# Patient Record
Sex: Male | Born: 1960 | Race: White | Hispanic: No | Marital: Married | Smoking: Current some day smoker
Health system: Southern US, Community
[De-identification: ages and names within clinical notes are randomized; demographics above are authoritative.]

## PROBLEM LIST (undated history)

## (undated) DIAGNOSIS — R51 Headache: Secondary | ICD-10-CM

## (undated) DIAGNOSIS — R519 Headache, unspecified: Secondary | ICD-10-CM

## (undated) DIAGNOSIS — S0291XA Unspecified fracture of skull, initial encounter for closed fracture: Secondary | ICD-10-CM

## (undated) DIAGNOSIS — M1990 Unspecified osteoarthritis, unspecified site: Secondary | ICD-10-CM

## (undated) HISTORY — PX: TONSILLECTOMY: SUR1361

---

## 1997-11-23 ENCOUNTER — Emergency Department (HOSPITAL_COMMUNITY): Admission: EM | Admit: 1997-11-23 | Discharge: 1997-11-23 | Payer: Self-pay | Admitting: Emergency Medicine

## 1997-11-23 ENCOUNTER — Encounter: Payer: Self-pay | Admitting: Emergency Medicine

## 2001-03-19 ENCOUNTER — Emergency Department (HOSPITAL_COMMUNITY): Admission: EM | Admit: 2001-03-19 | Discharge: 2001-03-19 | Payer: Self-pay | Admitting: Emergency Medicine

## 2001-03-19 ENCOUNTER — Encounter: Payer: Self-pay | Admitting: Emergency Medicine

## 2001-03-27 ENCOUNTER — Encounter: Payer: Self-pay | Admitting: Cardiovascular Disease

## 2001-03-27 ENCOUNTER — Ambulatory Visit (HOSPITAL_COMMUNITY): Admission: RE | Admit: 2001-03-27 | Discharge: 2001-03-27 | Payer: Self-pay | Admitting: Cardiovascular Disease

## 2003-09-02 ENCOUNTER — Emergency Department (HOSPITAL_COMMUNITY): Admission: EM | Admit: 2003-09-02 | Discharge: 2003-09-02 | Payer: Self-pay | Admitting: Emergency Medicine

## 2014-06-13 ENCOUNTER — Emergency Department (INDEPENDENT_AMBULATORY_CARE_PROVIDER_SITE_OTHER)
Admission: EM | Admit: 2014-06-13 | Discharge: 2014-06-13 | Disposition: A | Discharge status: Home / Self Care | Payer: BLUE CROSS/BLUE SHIELD | Source: Home / Self Care | Attending: Family Medicine | Admitting: Family Medicine

## 2014-06-13 ENCOUNTER — Encounter (HOSPITAL_COMMUNITY): Payer: Self-pay | Admitting: Emergency Medicine

## 2014-06-13 DIAGNOSIS — K088 Other specified disorders of teeth and supporting structures: Secondary | ICD-10-CM | POA: Diagnosis not present

## 2014-06-13 DIAGNOSIS — H6122 Impacted cerumen, left ear: Secondary | ICD-10-CM

## 2014-06-13 DIAGNOSIS — K0889 Other specified disorders of teeth and supporting structures: Secondary | ICD-10-CM

## 2014-06-13 MED ORDER — TRAMADOL HCL 50 MG PO TABS: 50.0000 mg | ORAL_TABLET | Freq: Four times a day (QID) | ORAL | Status: DC | PRN

## 2014-06-13 MED ORDER — CLINDAMYCIN HCL 300 MG PO CAPS: 300.0000 mg | ORAL_CAPSULE | Freq: Three times a day (TID) | ORAL | Status: DC

## 2014-06-13 MED ORDER — DOCUSATE SODIUM 50 MG/5ML PO LIQD
ORAL | Status: AC
  Filled 2014-06-13: qty 10

## 2014-06-13 NOTE — ED Notes (Signed)
C/o dental pain and cerumen impaction States he has right side ear pain due to cerumen impaction States he has throbbing pain States right side of mouth hurts States he has a Education officer, communitydentist appt on Thursday

## 2014-06-13 NOTE — Discharge Instructions (Signed)
Thank you for coming in today. Follow up with dentist.    Dental Pain A tooth ache may be caused by cavities (tooth decay). Cavities expose the nerve of the tooth to air and hot or cold temperatures. It may come from an infection or abscess (also called a boil or furuncle) around your tooth. It is also often caused by dental caries (tooth decay). This causes the pain you are having. DIAGNOSIS  Your caregiver can diagnose this problem by exam. TREATMENT   If caused by an infection, it may be treated with medications which kill germs (antibiotics) and pain medications as prescribed by your caregiver. Take medications as directed.  Only take over-the-counter or prescription medicines for pain, discomfort, or fever as directed by your caregiver.  Whether the tooth ache today is caused by infection or dental disease, you should see your dentist as soon as possible for further care. SEEK MEDICAL CARE IF: The exam and treatment you received today has been provided on an emergency basis only. This is not a substitute for complete medical or dental care. If your problem worsens or new problems (symptoms) appear, and you are unable to meet with your dentist, call or return to this location. SEEK IMMEDIATE MEDICAL CARE IF:   You have a fever.  You develop redness and swelling of your face, jaw, or neck.  You are unable to open your mouth.  You have severe pain uncontrolled by pain medicine. MAKE SURE YOU:   Understand these instructions.  Will watch your condition.  Will get help right away if you are not doing well or get worse. Document Released: 01/21/2005 Document Revised: 04/15/2011 Document Reviewed: 09/09/2007 [facility] Patient Information 2015 [place]Care, [place]. This information is not intended to replace advice given to you by your health care provider. Make sure you discuss any questions you have with your health care provider.   Cerumen Impaction A cerumen impaction is when the wax  in your ear forms a plug. This plug usually causes reduced hearing. Sometimes it also causes an earache or dizziness. Removing a cerumen impaction can be difficult and painful. The wax sticks to the ear canal. The canal is sensitive and bleeds easily. If you try to remove a heavy wax buildup with a cotton tipped swab, you may push it in further. Irrigation with water, suction, and small ear curettes may be used to clear out the wax. If the impaction is fixed to the skin in the ear canal, ear drops may be needed for a few days to loosen the wax. People who build up a lot of wax frequently can use ear wax removal products available in your local drugstore. SEEK MEDICAL CARE IF:  You develop an earache, increased hearing loss, or marked dizziness. Document Released: 02/29/2004 Document Revised: 04/15/2011 Document Reviewed: 04/20/2009 [facility] Patient Information 2015 [place]ExitCare, [place]LLC. This information is not intended to replace advice given to you by your health care provider. Make sure you discuss any questions you have with your health care provider.

## 2014-06-13 NOTE — ED Provider Notes (Signed)
Si RaiderChristopher A Mueller is a 54 y.o. male who presents to Urgent Care today for right-sided dental pain. Patient has right mandibular dental pain starting 3 days ago. The pain has become severe and radiates to his ear and his angle of the jaw. Pain is throbbing. He has made an appointment with his dentist for Thursday. He's tried lots of ibuprofen which helps only a little. He denies any fevers or chills nausea vomiting or diarrhea. Additionally he suspects that he has left-sided cerumen impaction which is a frequent occurrence for him.   History reviewed. No pertinent past medical history. No past surgical history on file. History  Substance Use Topics  . Smoking status: Not on file  . Smokeless tobacco: Not on file  . Alcohol Use: Not on file   ROS as above Medications: No current facility-administered medications for this encounter.   Current Outpatient Prescriptions  Medication Sig Dispense Refill  . clindamycin (CLEOCIN) 300 MG capsule Take 1 capsule (300 mg total) by mouth 3 (three) times daily. 30 capsule 0  . traMADol (ULTRAM) 50 MG tablet Take 1 tablet (50 mg total) by mouth every 6 (six) hours as needed. 10 tablet 0   No Known Allergies   Exam:  BP 115/75 mmHg  Pulse 77  Temp(Src) 99.4 F (37.4 C) (Oral)  Resp 14  SpO2 97% Gen: Well NAD HEENT: EOMI,  MMM cerumen impaction right is normal. Right lower molar with cavity tender to touch with gumline erythema without abscess. Tender cervical right lymphadenopathy present  Lungs: Normal work of breathing. CTABL Heart: RRR no MRG Abd: NABS, Soft. Nondistended, Nontender Exts: Brisk capillary refill, warm and well perfused.   Patient had cerumen irrigation of the left ear.  No results found for this or any previous visit (from the past 24 hour(s)). No results found.  Assessment and Plan: 54 y.o. male with  1) dental pain: Treat with clindamycin. I suspect patient is developing a dental infection and may have  lymphadenitis. Follow-up with Maurine Ministerennis. Treat pain with tramadol. 2) cerumen impaction irrigated. Return as needed.  Discussed warning signs or symptoms. Please see discharge instructions. Patient expresses understanding.     Rodolph BongEvan S Corey, MD 06/13/14 [phone]

## 2014-06-14 MED ORDER — DICLOFENAC POTASSIUM 50 MG PO TABS: 50.0000 mg | ORAL_TABLET | Freq: Three times a day (TID) | ORAL | Status: DC

## 2014-06-14 NOTE — ED Notes (Signed)
Pt  states still  Having  Pain   Dr  Artis Flockkindl  Notified   He    E  Rx    [facility]ic[facility]  [address]     Pt  Advised  To  followup  With  A  Dentist        As  Scheduled

## 2015-02-21 ENCOUNTER — Emergency Department (HOSPITAL_COMMUNITY): Payer: BLUE CROSS/BLUE SHIELD

## 2015-02-21 ENCOUNTER — Emergency Department (HOSPITAL_COMMUNITY)
Admission: EM | Admit: 2015-02-21 | Discharge: 2015-02-21 | Disposition: A | Discharge status: Home / Self Care | Payer: BLUE CROSS/BLUE SHIELD | Attending: Physician Assistant | Admitting: Physician Assistant

## 2015-02-21 ENCOUNTER — Encounter (HOSPITAL_COMMUNITY): Payer: Self-pay | Admitting: Emergency Medicine

## 2015-02-21 DIAGNOSIS — Y998 Other external cause status: Secondary | ICD-10-CM | POA: Diagnosis not present

## 2015-02-21 DIAGNOSIS — S4991XA Unspecified injury of right shoulder and upper arm, initial encounter: Secondary | ICD-10-CM | POA: Diagnosis not present

## 2015-02-21 DIAGNOSIS — S299XXA Unspecified injury of thorax, initial encounter: Secondary | ICD-10-CM | POA: Insufficient documentation

## 2015-02-21 DIAGNOSIS — Y92410 Unspecified street and highway as the place of occurrence of the external cause: Secondary | ICD-10-CM | POA: Insufficient documentation

## 2015-02-21 DIAGNOSIS — Y9389 Activity, other specified: Secondary | ICD-10-CM | POA: Diagnosis not present

## 2015-02-21 DIAGNOSIS — Z041 Encounter for examination and observation following transport accident: Secondary | ICD-10-CM

## 2015-02-21 DIAGNOSIS — S0993XA Unspecified injury of face, initial encounter: Secondary | ICD-10-CM | POA: Diagnosis not present

## 2015-02-21 DIAGNOSIS — R0781 Pleurodynia: Secondary | ICD-10-CM

## 2015-02-21 DIAGNOSIS — Z043 Encounter for examination and observation following other accident: Secondary | ICD-10-CM

## 2015-02-21 MED ORDER — OXYCODONE-ACETAMINOPHEN 5-325 MG PO TABS: 2.0000 | ORAL_TABLET | ORAL | Status: DC | PRN

## 2015-02-21 MED ORDER — IBUPROFEN 800 MG PO TABS: 800.0000 mg | ORAL_TABLET | Freq: Three times a day (TID) | ORAL | Status: DC
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
## 2015-02-21 NOTE — ED Notes (Signed)
ED PA at bedside
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
## 2015-02-27 ENCOUNTER — Emergency Department (HOSPITAL_COMMUNITY)
Admission: EM | Admit: 2015-02-27 | Discharge: 2015-02-27 | Disposition: A | Discharge status: Home / Self Care | Payer: BLUE CROSS/BLUE SHIELD | Attending: Emergency Medicine | Admitting: Emergency Medicine

## 2015-02-27 ENCOUNTER — Emergency Department (HOSPITAL_COMMUNITY): Payer: BLUE CROSS/BLUE SHIELD

## 2015-02-27 ENCOUNTER — Encounter (HOSPITAL_COMMUNITY): Payer: Self-pay | Admitting: Emergency Medicine

## 2015-02-27 DIAGNOSIS — M546 Pain in thoracic spine: Secondary | ICD-10-CM | POA: Diagnosis present

## 2015-02-27 DIAGNOSIS — S29002D Unspecified injury of muscle and tendon of back wall of thorax, subsequent encounter: Secondary | ICD-10-CM | POA: Diagnosis not present

## 2015-02-27 DIAGNOSIS — M542 Cervicalgia: Secondary | ICD-10-CM | POA: Insufficient documentation

## 2015-02-27 DIAGNOSIS — Z791 Long term (current) use of non-steroidal anti-inflammatories (NSAID): Secondary | ICD-10-CM | POA: Diagnosis not present

## 2015-02-27 DIAGNOSIS — R0789 Other chest pain: Secondary | ICD-10-CM | POA: Diagnosis not present

## 2015-02-27 LAB — COMPREHENSIVE METABOLIC PANEL
ALT: 13 U/L — AB (ref 17–63)
AST: 21 U/L (ref 15–41)
Albumin: 3.8 g/dL (ref 3.5–5.0)
Alkaline Phosphatase: 46 U/L (ref 38–126)
Anion gap: 9 (ref 5–15)
BUN: 15 mg/dL (ref 6–20)
CALCIUM: 8.7 mg/dL — AB (ref 8.9–10.3)
CO2: 27 mmol/L (ref 22–32)
Chloride: 104 mmol/L (ref 101–111)
Creatinine, Ser: 0.69 mg/dL (ref 0.61–1.24)
GFR calc Af Amer: >60 mL/min (ref >60)
GFR calc non Af Amer: >60 mL/min (ref >60)
Glucose, Bld: 122 mg/dL — ABNORMAL HIGH (ref 65–99)
Potassium: 4.4 mmol/L (ref 3.5–5.1)
Sodium: 140 mmol/L (ref 135–145)
Total Bilirubin: 0.3 mg/dL (ref 0.3–1.2)
Total Protein: 6.2 g/dL — ABNORMAL LOW (ref 6.5–8.1)

## 2015-02-27 LAB — CBC WITH DIFFERENTIAL/PLATELET
BASOS PCT: 0 %
Basophils Absolute: 0 10*3/uL (ref 0.0–0.1)
Eosinophils Absolute: 0.3 10*3/uL (ref 0.0–0.7)
Eosinophils Relative: 3 %
HCT: 40.9 % (ref 39.0–52.0)
HEMOGLOBIN: 13.4 g/dL (ref 13.0–17.0)
LYMPHS PCT: 13 %
Lymphs Abs: 1.2 10*3/uL (ref 0.7–4.0)
MCH: 30.6 pg (ref 26.0–34.0)
MCHC: 32.8 g/dL (ref 30.0–36.0)
MCV: 93.4 fL (ref 78.0–100.0)
MONOS PCT: 8 %
Monocytes Absolute: 0.8 10*3/uL (ref 0.1–1.0)
NEUTROS ABS: 7.2 10*3/uL (ref 1.7–7.7)
Neutrophils Relative %: 76 %
Platelets: 225 10*3/uL (ref 150–400)
RBC: 4.38 MIL/uL (ref 4.22–5.81)
RDW: 12.9 % (ref 11.5–15.5)
WBC: 9.5 10*3/uL (ref 4.0–10.5)

## 2015-02-27 MED ORDER — IOHEXOL 350 MG/ML SOLN
100.0000 mL | Freq: Once | INTRAVENOUS | Status: AC | PRN
  Administered 2015-02-27: 100 mL via INTRAVENOUS

## 2015-02-27 MED ORDER — NAPROXEN 500 MG PO TABS: 500.0000 mg | ORAL_TABLET | Freq: Two times a day (BID) | ORAL | Status: DC

## 2015-02-27 MED ORDER — ORPHENADRINE CITRATE ER 100 MG PO TB12: 100.0000 mg | ORAL_TABLET | Freq: Two times a day (BID) | ORAL | Status: DC

## 2015-02-27 NOTE — ED Notes (Signed)
MD at bedside. EDP PRESENT 
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
## 2015-02-27 NOTE — ED Notes (Signed)
Pt taking on the telephone. Pt at present appears in NAD

## 2015-02-27 NOTE — ED Notes (Signed)
Pt reports pain  rx given at last visit has been completed and gave good relief.

## 2015-02-27 NOTE — ED Notes (Signed)
Patient transported to CT 

## 2015-02-27 NOTE — ED Notes (Signed)
Pt talking on phone inquiring about rental car. Informed would return after his call. Gave pt call bell

## 2015-02-27 NOTE — Discharge Instructions (Signed)
Back Pain, Adult At this time, there is no new fracture identified. You had a small abnormality of one of the vertebral bodies at T8 that was pre-existing. However he must follow-up with your physician because a small lung nodule that will require ongoing surveillance has been identified. You may need repeat CT scans at 6 or 12 months. Do not fail to schedule a follow-up appointment. Back pain is very common in adults.The cause of back pain is rarely dangerous and the pain often gets better over time.The cause of your back pain may not be known. Some common causes of back pain include:  Strain of the muscles or ligaments supporting the spine.  Wear and tear (degeneration) of the spinal disks.  Arthritis.  Direct injury to the back. For many people, back pain may return. Since back pain is rarely dangerous, most people can learn to manage this condition on their own. HOME CARE INSTRUCTIONS Watch your back pain for any changes. The following actions may help to lessen any discomfort you are feeling:  Remain active. It is stressful on your back to sit or stand in one place for long periods of time. Do not sit, drive, or stand in one place for more than 30 minutes at a time. Take short walks on even surfaces as soon as you are able.Try to increase the length of time you walk each day.  Exercise regularly as directed by your health care provider. Exercise helps your back heal faster. It also helps avoid future injury by keeping your muscles strong and flexible.  Do not stay in bed.Resting more than 1-2 days can delay your recovery.  Pay attention to your body when you bend and lift. The most comfortable positions are those that put less stress on your recovering back. Always use proper lifting techniques, including:  Bending your knees.  Keeping the load close to your body.  Avoiding twisting.  Find a comfortable position to sleep. Use a firm mattress and lie on your side with your knees  slightly bent. If you lie on your back, put a pillow under your knees.  Avoid feeling anxious or stressed.Stress increases muscle tension and can worsen back pain.It is important to recognize when you are anxious or stressed and learn ways to manage it, such as with exercise.  Take medicines only as directed by your health care provider. Over-the-counter medicines to reduce pain and inflammation are often the most helpful.Your health care provider may prescribe muscle relaxant drugs.These medicines help dull your pain so you can more quickly return to your normal activities and healthy exercise.  Apply ice to the injured area:  Put ice in a plastic bag.  Place a towel between your skin and the bag.  Leave the ice on for 20 minutes, 2-3 times a day for the first 2-3 days. After that, ice and heat may be alternated to reduce pain and spasms.  Maintain a healthy weight. Excess weight puts extra stress on your back and makes it difficult to maintain good posture. SEEK MEDICAL CARE IF:  You have pain that is not relieved with rest or medicine.  You have increasing pain going down into the legs or buttocks.  You have pain that does not improve in one week.  You have night pain.  You lose weight.  You have a fever or chills. SEEK IMMEDIATE MEDICAL CARE IF:   You develop new bowel or bladder control problems.  You have unusual weakness or numbness in your arms or  legs.  You develop nausea or vomiting.  You develop abdominal pain.  You feel faint.   This information is not intended to replace advice given to you by your health care provider. Make sure you discuss any questions you have with your health care provider.   Document Released: 01/21/2005 Document Revised: 02/11/2014 Document Reviewed: 05/25/2013 Elsevier Interactive Patient Education [employer].

## 2016-01-06 ENCOUNTER — Encounter (HOSPITAL_COMMUNITY): Payer: Self-pay | Admitting: *Deleted

## 2016-01-06 ENCOUNTER — Ambulatory Visit (HOSPITAL_COMMUNITY)
Admission: EM | Admit: 2016-01-06 | Discharge: 2016-01-06 | Disposition: A | Discharge status: Home / Self Care | Payer: BLUE CROSS/BLUE SHIELD | Attending: Emergency Medicine | Admitting: Emergency Medicine

## 2016-01-06 DIAGNOSIS — F17290 Nicotine dependence, other tobacco product, uncomplicated: Secondary | ICD-10-CM | POA: Insufficient documentation

## 2016-01-06 DIAGNOSIS — H1031 Unspecified acute conjunctivitis, right eye: Secondary | ICD-10-CM | POA: Diagnosis not present

## 2016-01-06 DIAGNOSIS — J0100 Acute maxillary sinusitis, unspecified: Secondary | ICD-10-CM | POA: Diagnosis not present

## 2016-01-06 DIAGNOSIS — H1032 Unspecified acute conjunctivitis, left eye: Secondary | ICD-10-CM | POA: Insufficient documentation

## 2016-01-06 DIAGNOSIS — Z79899 Other long term (current) drug therapy: Secondary | ICD-10-CM | POA: Diagnosis not present

## 2016-01-06 HISTORY — DX: Unspecified fracture of skull, initial encounter for closed fracture: S02.91XA

## 2016-01-06 LAB — POCT RAPID STREP A: Streptococcus, Group A Screen (Direct): NEGATIVE

## 2016-01-06 MED ORDER — CIPROFLOXACIN HCL 0.3 % OP SOLN: 1.0000 [drp] | OPHTHALMIC | 0 refills | Status: DC

## 2016-01-06 MED ORDER — FLUORESCEIN SODIUM 1 MG OP STRP
ORAL_STRIP | OPHTHALMIC | Status: AC
  Filled 2016-01-06: qty 1

## 2016-01-06 MED ORDER — GUAIFENESIN ER 600 MG PO TB12: 1200.0000 mg | ORAL_TABLET | Freq: Two times a day (BID) | ORAL | 0 refills | Status: DC

## 2016-01-06 MED ORDER — FEXOFENADINE-PSEUDOEPHED ER 60-120 MG PO TB12: 1.0000 | ORAL_TABLET | Freq: Two times a day (BID) | ORAL | 0 refills | Status: DC

## 2016-01-06 MED ORDER — AMOXICILLIN-POT CLAVULANATE 875-125 MG PO TABS: 1.0000 | ORAL_TABLET | Freq: Two times a day (BID) | ORAL | 0 refills | Status: DC

## 2016-01-06 MED ORDER — TETRACAINE HCL 0.5 % OP SOLN
OPHTHALMIC | Status: AC
  Filled 2016-01-06: qty 2

## 2016-01-06 MED ORDER — FLUTICASONE PROPIONATE 50 MCG/ACT NA SUSP: 1.0000 | Freq: Every day | NASAL | 2 refills | Status: DC

## 2016-01-06 NOTE — ED Provider Notes (Signed)
[facility]    CSN: [id number] Arrival date & time: 01/06/16  1203     History   Chief Complaint Chief Complaint  Patient presents with  . Eye Problem    HPI Bob Mueller is a 55 y.o. male.   HPI Patient presents to the clinic today with complaints of sinus congestion/pain, right greater than left eye pain and redness. States that over a week ago he was having "flulike" symptoms with cough, sinus congestion, body aches, chills and sore throat.  He did not go see a physician for this. Last Tuesday he began having increased right eye redness and slight purulent discharge. Itching in and around his eye. Starting having the same symptoms with the left eye yesterday.  No complaints of photophobia, blurred vision, headache, dizziness. He has been taking over-the-counter medication without improvement.  Continues to have postnasal drainage with nonproductive cough. No chest pain or shortness of breath.   Past Medical History:  Diagnosis Date  . CAD (coronary artery disease)   . Skull fracture (HCC)     There are no active problems to display for this patient.   Past Surgical History:  Procedure Laterality Date  . TONSILLECTOMY         Home Medications    Prior to Admission medications   Medication Sig Start Date End Date Taking? Authorizing Provider  ibuprofen (ADVIL,MOTRIN) 200 MG tablet Take 600-800 mg by mouth every 6 (six) hours as needed for moderate pain.    Yes Historical Provider, MD  orphenadrine (NORFLEX) 100 MG tablet Take 1 tablet (100 mg total) by mouth 2 (two) times daily. 02/27/15  Yes Arby BarretteMarcy Pfeiffer, MD  amoxicillin-clavulanate (AUGMENTIN) 875-125 MG tablet Take 1 tablet by mouth every 12 (twelve) hours. 01/06/16   Naida SleightJames M Kiaya Haliburton, PA-C  ciprofloxacin (CILOXAN) 0.3 % ophthalmic solution Place 1 drop into both eyes every 2 (two) hours. Administer 1 drop, every 2 hours, while awake, for 2 days. Then 1 drop, every 4 hours, while awake, for the  next 5 days. 01/06/16   Naida SleightJames M Aalani Aikens, PA-C  fexofenadine-pseudoephedrine (ALLEGRA-D) 60-120 MG 12 hr tablet Take 1 tablet by mouth every 12 (twelve) hours. 01/06/16   Naida SleightJames M Chaela Branscum, PA-C  fluticasone (FLONASE) 50 MCG/ACT nasal spray Place 1 spray into both nostrils daily. 01/06/16   Naida SleightJames M Squire Withey, PA-C  guaiFENesin (MUCINEX) 600 MG 12 hr tablet Take 2 tablets (1,200 mg total) by mouth 2 (two) times daily. 01/06/16   Naida SleightJames M Aydenn Gervin, PA-C  ibuprofen (ADVIL,MOTRIN) 800 MG tablet Take 1 tablet (800 mg total) by mouth 3 (three) times daily. 02/21/15   Shawn C Joy, PA-C  naproxen (NAPROSYN) 500 MG tablet Take 1 tablet (500 mg total) by mouth 2 (two) times daily. 02/27/15   Arby BarretteMarcy Pfeiffer, MD  oxyCODONE-acetaminophen (PERCOCET/ROXICET) 5-325 MG tablet Take 2 tablets by mouth every 4 (four) hours as needed for severe pain. 02/21/15   Shawn C Joy, PA-C  pseudoephedrine-acetaminophen (TYLENOL SINUS) 30-500 MG TABS tablet Take 2 tablets by mouth every 4 (four) hours as needed (cold symptoms).    Historical Provider, MD    Family History No family history on file.  Social History Social History  Substance Use Topics  . Smoking status: Current Some Day Smoker    Types: Cigars  . Smokeless tobacco: Never Used     Comment: smokes cigar "very rarely"  . Alcohol use Yes     Comment: rarely     Allergies   Patient has no  known allergies.   Review of Systems Review of Systems  Constitutional: Positive for fatigue. Negative for chills and fever.  HENT: Positive for congestion and facial swelling. Negative for ear pain and hearing loss.   Eyes: Positive for pain, discharge, redness and itching. Negative for photophobia and visual disturbance.  Respiratory: Positive for cough. Negative for apnea, chest tightness, shortness of breath and wheezing.   Cardiovascular: Negative for chest pain.  Gastrointestinal: Negative.   Genitourinary: Negative.   Musculoskeletal: Positive for myalgias.  Skin: Negative.     Neurological: Negative for light-headedness, numbness and headaches.  Psychiatric/Behavioral: Negative.      Physical Exam Triage Vital Signs ED Triage Vitals  Enc Vitals Group     BP 01/06/16 1234 122/77     Pulse Rate 01/06/16 1234 74     Resp 01/06/16 1234 16     Temp 01/06/16 1234 99.5 F (37.5 C)     Temp Source 01/06/16 1234 Oral     SpO2 01/06/16 1234 97 %     Weight --      Height --      Head Circumference --      Peak Flow --      Pain Score 01/06/16 1244 4     Pain Loc --      Pain Edu? --      Excl. in GC? --    No data found.   Updated Vital Signs BP 122/77   Pulse 74   Temp 99.5 F (37.5 C) (Oral)   Resp 16   SpO2 97%   Visual Acuity Right Eye Distance: 20/70 Left Eye Distance: 20/70 Bilateral Distance: 20/50  Right Eye Near:   Left Eye Near:    Bilateral Near:     Physical Exam  Constitutional: He is oriented to person, place, and time. He appears well-developed. No distress.  HENT:  Head: Normocephalic and atraumatic.  Does have some throat redness. No exudates. Moderate to marked bilateral frontal maxillary sinus tenderness. He does have slight right facial swelling.  Eyes: EOM are normal. Right eye exhibits discharge. Left eye exhibits discharge. Scleral icterus is present.  With extraocular movements patient does have bilateral frontal sinus soreness. No true eye pain.  Neck: Normal range of motion.  Cardiovascular: Normal rate.   Pulmonary/Chest: Effort normal. No respiratory distress.  Abdominal: He exhibits no distension.  Musculoskeletal: Normal range of motion.  Lymphadenopathy:    He has cervical adenopathy (Right side.).  Neurological: He is alert and oriented to person, place, and time.  Skin: Skin is warm and dry.  Psychiatric: He has a normal mood and affect.     UC Treatments / Results  Labs (all labs ordered are listed, but only abnormal results are displayed) Labs Reviewed  POCT RAPID STREP A    EKG  EKG  Interpretation None       Radiology No results found.  Procedures Procedures (including critical care time)  Medications Ordered in UC Medications - No data to display   Initial Impression / Assessment and Plan / UC Course  I have reviewed the triage vital signs and the nursing notes.  Pertinent labs & imaging results that were available during my care of the patient were reviewed by me and considered in my medical decision making (see chart for details).  Clinical Course      Final Clinical Impressions(s) / UC Diagnoses   Final diagnoses:  Acute non-recurrent maxillary sinusitis  Acute bacterial conjunctivitis of right eye  Acute bacterial conjunctivitis of left eye    New Prescriptions New Prescriptions   AMOXICILLIN-CLAVULANATE (AUGMENTIN) 875-125 MG TABLET    Take 1 tablet by mouth every 12 (twelve) hours.   CIPROFLOXACIN (CILOXAN) 0.3 % OPHTHALMIC SOLUTION    Place 1 drop into both eyes every 2 (two) hours. Administer 1 drop, every 2 hours, while awake, for 2 days. Then 1 drop, every 4 hours, while awake, for the next 5 days.   FEXOFENADINE-PSEUDOEPHEDRINE (ALLEGRA-D) 60-120 MG 12 HR TABLET    Take 1 tablet by mouth every 12 (twelve) hours.   FLUTICASONE (FLONASE) 50 MCG/ACT NASAL SPRAY    Place 1 spray into both nostrils daily.   GUAIFENESIN (MUCINEX) 600 MG 12 HR TABLET    Take 2 tablets (1,200 mg total) by mouth 2 (two) times daily.  Patient seen with my attending Dr. Piedad Climeshonig  do not think patient has any signs of orbital cellulitis. Patient will take all medications as prescribed. If his symptoms worsen or are not improved over the next few days he can return to our clinic to be seen. Also recommend that he be evaluated by his primary care physician. All questions answered.   Naida SleightJames M Monroe Qin, PA-C 01/06/16 1409

## 2016-01-06 NOTE — ED Notes (Signed)
Pt does not wear any corrective lenses.

## 2016-01-06 NOTE — ED Triage Notes (Signed)
Pt states recently recovering from self-described flu symptoms.  Started with right eye sxs 4 days ago; started with left eye sxs today.

## 2016-01-09 LAB — CULTURE, GROUP A STREP (THRC)

## 2016-05-27 ENCOUNTER — Encounter (INDEPENDENT_AMBULATORY_CARE_PROVIDER_SITE_OTHER): Payer: Self-pay

## 2016-05-27 ENCOUNTER — Ambulatory Visit (INDEPENDENT_AMBULATORY_CARE_PROVIDER_SITE_OTHER): Payer: BLUE CROSS/BLUE SHIELD | Admitting: Orthopaedic Surgery

## 2016-05-27 ENCOUNTER — Ambulatory Visit (INDEPENDENT_AMBULATORY_CARE_PROVIDER_SITE_OTHER): Payer: Self-pay

## 2016-05-27 ENCOUNTER — Encounter (INDEPENDENT_AMBULATORY_CARE_PROVIDER_SITE_OTHER): Payer: Self-pay | Admitting: Orthopaedic Surgery

## 2016-05-27 VITALS — BP 122/81 | HR 84 | Ht 71.0 in | Wt 160.0 lb

## 2016-05-27 DIAGNOSIS — M542 Cervicalgia: Secondary | ICD-10-CM

## 2016-05-27 DIAGNOSIS — M4722 Other spondylosis with radiculopathy, cervical region: Secondary | ICD-10-CM | POA: Diagnosis not present
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
## 2016-05-27 NOTE — Addendum Note (Signed)
Addended by: Rogers Seeds on: 05/27/2016 05:22 PM   Modules accepted: Orders

## 2016-06-06 ENCOUNTER — Ambulatory Visit
Admission: RE | Admit: 2016-06-06 | Discharge: 2016-06-06 | Disposition: A | Discharge status: Home / Self Care | Payer: BLUE CROSS/BLUE SHIELD | Source: Ambulatory Visit | Attending: Orthopaedic Surgery | Admitting: Orthopaedic Surgery

## 2016-06-06 DIAGNOSIS — M4722 Other spondylosis with radiculopathy, cervical region: Secondary | ICD-10-CM

## 2016-06-26 ENCOUNTER — Ambulatory Visit (INDEPENDENT_AMBULATORY_CARE_PROVIDER_SITE_OTHER): Payer: BLUE CROSS/BLUE SHIELD | Admitting: Orthopaedic Surgery

## 2016-06-26 ENCOUNTER — Encounter (INDEPENDENT_AMBULATORY_CARE_PROVIDER_SITE_OTHER): Payer: Self-pay | Admitting: Orthopaedic Surgery

## 2016-06-26 ENCOUNTER — Encounter (INDEPENDENT_AMBULATORY_CARE_PROVIDER_SITE_OTHER): Payer: Self-pay

## 2016-06-26 VITALS — BP 107/70 | HR 60 | Ht 71.0 in | Wt 160.0 lb

## 2016-06-26 DIAGNOSIS — M4722 Other spondylosis with radiculopathy, cervical region: Secondary | ICD-10-CM | POA: Diagnosis not present
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
## 2016-07-16 ENCOUNTER — Encounter (INDEPENDENT_AMBULATORY_CARE_PROVIDER_SITE_OTHER): Payer: Self-pay | Admitting: Orthopedic Surgery

## 2016-07-21 ENCOUNTER — Encounter (HOSPITAL_COMMUNITY): Payer: Self-pay | Admitting: Emergency Medicine

## 2016-07-21 ENCOUNTER — Emergency Department (HOSPITAL_COMMUNITY)
Admission: EM | Admit: 2016-07-21 | Discharge: 2016-07-21 | Disposition: A | Discharge status: Home / Self Care | Payer: BLUE CROSS/BLUE SHIELD | Attending: Emergency Medicine | Admitting: Emergency Medicine

## 2016-07-21 ENCOUNTER — Emergency Department (HOSPITAL_COMMUNITY): Payer: BLUE CROSS/BLUE SHIELD

## 2016-07-21 DIAGNOSIS — Z79899 Other long term (current) drug therapy: Secondary | ICD-10-CM | POA: Insufficient documentation

## 2016-07-21 DIAGNOSIS — Y929 Unspecified place or not applicable: Secondary | ICD-10-CM | POA: Insufficient documentation

## 2016-07-21 DIAGNOSIS — I2510 Atherosclerotic heart disease of native coronary artery without angina pectoris: Secondary | ICD-10-CM | POA: Diagnosis not present

## 2016-07-21 DIAGNOSIS — S61317A Laceration without foreign body of left little finger with damage to nail, initial encounter: Secondary | ICD-10-CM

## 2016-07-21 DIAGNOSIS — Z23 Encounter for immunization: Secondary | ICD-10-CM | POA: Diagnosis not present

## 2016-07-21 DIAGNOSIS — Y999 Unspecified external cause status: Secondary | ICD-10-CM | POA: Diagnosis not present

## 2016-07-21 DIAGNOSIS — S61217A Laceration without foreign body of left little finger without damage to nail, initial encounter: Secondary | ICD-10-CM | POA: Insufficient documentation

## 2016-07-21 DIAGNOSIS — Y9389 Activity, other specified: Secondary | ICD-10-CM | POA: Insufficient documentation

## 2016-07-21 DIAGNOSIS — W260XXA Contact with knife, initial encounter: Secondary | ICD-10-CM | POA: Diagnosis not present

## 2016-07-21 DIAGNOSIS — F17290 Nicotine dependence, other tobacco product, uncomplicated: Secondary | ICD-10-CM | POA: Insufficient documentation

## 2016-07-21 MED ORDER — LIDOCAINE HCL (PF) 1 % IJ SOLN: 30.0000 mL | Freq: Once | INTRAMUSCULAR | Status: DC

## 2016-07-21 MED ORDER — TETANUS-DIPHTH-ACELL PERTUSSIS 5-2.5-18.5 LF-MCG/0.5 IM SUSP
0.5000 mL | Freq: Once | INTRAMUSCULAR | Status: AC
  Administered 2016-07-21: 0.5 mL via INTRAMUSCULAR
  Filled 2016-07-21: qty 0.5

## 2016-07-21 MED ORDER — IBUPROFEN 800 MG PO TABS: 800.0000 mg | ORAL_TABLET | Freq: Four times a day (QID) | ORAL | 0 refills | Status: DC | PRN

## 2016-07-21 MED ORDER — LIDOCAINE HCL 1 % IJ SOLN
INTRAMUSCULAR | Status: AC
  Administered 2016-07-21: 20 mL
  Filled 2016-07-21: qty 20

## 2016-07-21 NOTE — ED Provider Notes (Signed)
[facility]-EMERGENCY DEPT Provider Note   CSN: [id number] Arrival date & time: 07/21/16  1200  By signing my name below, I, Linna Darner, attest that this documentation has been prepared under the direction and in the presence of [employer], PA-C. Electronically Signed: Linna Darner, Scribe. 07/21/2016. 2:05 PM.  History   Chief Complaint Chief Complaint  Patient presents with  . Finger Injury   The history is provided by the patient. No language interpreter was used.    HPI Comments: Bob Mueller is a right-hand dominant 56 y.o. male who presents to the Emergency Department for evaluation of a left pinky finger laceration sustained a couple of hours ago. He states he lacerated his left pinky finger with a kitchen knife while cutting a cantaloupe. Patient endorses some throbbing pain secondary to the wound. He immediately wrapped the finger with tissue after sustaining the wound and notes it is now hemostatic. No other treatments PTA. He is able to bend his left pinky finger normally. Patient's tetanus status is out of date. He denies numbness/tingling, focal weakness, or any other associated symptoms.  Past Medical History:  Diagnosis Date  . CAD (coronary artery disease)   . Skull fracture (HCC)     There are no active problems to display for this patient.   Past Surgical History:  Procedure Laterality Date  . TONSILLECTOMY         Home Medications    Prior to Admission medications   Medication Sig Start Date End Date Taking? Authorizing Provider  amoxicillin-clavulanate (AUGMENTIN) 875-125 MG tablet Take 1 tablet by mouth every 12 (twelve) hours. Patient not taking: Reported on 05/27/2016 01/06/16   Naida Sleight, PA-C  ciprofloxacin (CILOXAN) 0.3 % ophthalmic solution Place 1 drop into both eyes every 2 (two) hours. Administer 1 drop, every 2 hours, while awake, for 2 days. Then 1 drop, every 4 hours, while awake, for the next 5 days. Patient not taking:  Reported on 05/27/2016 01/06/16   Naida Sleight, PA-C  fexofenadine-pseudoephedrine (ALLEGRA-D) 60-120 MG 12 hr tablet Take 1 tablet by mouth every 12 (twelve) hours. Patient not taking: Reported on 05/27/2016 01/06/16   Naida Sleight, PA-C  fluticasone [facility]) 50 MCG/ACT nasal spray Place 1 spray into both nostrils daily. Patient not taking: Reported on 05/27/2016 01/06/16   Naida Sleight, PA-C  guaiFENesin (MUCINEX) 600 MG 12 hr tablet Take 2 tablets (1,200 mg total) by mouth 2 (two) times daily. Patient not taking: Reported on 05/27/2016 01/06/16   Naida Sleight, PA-C  ibuprofen (ADVIL,MOTRIN) 800 MG tablet Take 1 tablet (800 mg total) by mouth every 6 (six) hours as needed. 07/21/16   Frutoso Dimare, Waylan Boga, PA-C  naproxen (NAPROSYN) 500 MG tablet Take 1 tablet (500 mg total) by mouth 2 (two) times daily. Patient not taking: Reported on 05/27/2016 02/27/15   Arby Barrette, MD  orphenadrine (NORFLEX) 100 MG tablet Take 1 tablet (100 mg total) by mouth 2 (two) times daily. Patient not taking: Reported on 05/27/2016 02/27/15   Arby Barrette, MD  oxyCODONE-acetaminophen (PERCOCET/ROXICET) 5-325 MG tablet Take 2 tablets by mouth every 4 (four) hours as needed for severe pain. Patient not taking: Reported on 05/27/2016 02/21/15   Anselm Pancoast, PA-C    Family History History reviewed. No pertinent family history.  Social History Social History  Substance Use Topics  . Smoking status: Current Some Day Smoker    Types: Cigars  . Smokeless tobacco: Never Used     Comment: smokes cigar "  very rarely"  . Alcohol use Yes     Comment: rarely     Allergies   Patient has no known allergies.   Review of Systems Review of Systems  Skin: Positive for wound.  Neurological: Negative for weakness and numbness.   Physical Exam Updated Vital Signs BP 105/76 (BP Location: Right Arm)   Pulse 69   Temp 98.2 F (36.8 C) (Oral)   Resp 16   Ht 5' 10.5" (1.791 m)   Wt 70.3 kg (155 lb)   SpO2 100%   BMI  21.93 kg/m   Physical Exam  Constitutional: He appears well-developed and well-nourished. No distress.  HENT:  Head: Normocephalic and atraumatic.  Mouth/Throat: Oropharynx is clear and moist. No oropharyngeal exudate.  Eyes: Conjunctivae are normal. Pupils are equal, round, and reactive to light. Right eye exhibits no discharge. Left eye exhibits no discharge. No scleral icterus.  Neck: Normal range of motion. Neck supple. No thyromegaly present.  Cardiovascular: Normal rate, regular rhythm, normal heart sounds and intact distal pulses.  Exam reveals no gallop and no friction rub.   No murmur heard. Pulmonary/Chest: Effort normal and breath sounds normal. No stridor. No respiratory distress. He has no wheezes. He has no rales.  Abdominal: He exhibits no distension.  Musculoskeletal: He exhibits no edema.  Left hand: 1 cm laceration to the radial aspect of the distal left fifth digit. Normal sensation. Cap refill < 2 seconds. Full range of motion, flexion, extension, abduction, and adduction. Very superficial nail involvement (2 mm), no nail bed involvement  Lymphadenopathy:    He has no cervical adenopathy.  Neurological: He is alert. Coordination normal.  Skin: Skin is warm and dry. No rash noted. He is not diaphoretic. No pallor.  Psychiatric: He has a normal mood and affect.  Nursing note and vitals reviewed.  ED Treatments / Results  Labs (all labs ordered are listed, but only abnormal results are displayed) Labs Reviewed - No data to display  EKG  EKG Interpretation None       Radiology Dg Finger Little Left  Result Date: 07/21/2016 CLINICAL DATA:  Laceration. EXAM: LEFT LITTLE FINGER 2+V COMPARISON:  None. FINDINGS: A skin laceration is seen adjacent to the distal tuft of the fifth finger. No foreign body or underlying fracture is seen. Degenerative changes are noted. IMPRESSION: No foreign body or fracture identified.  Skin laceration as above. Electronically Signed    By: Gerome Samavid  Williams III M.D   On: 07/21/2016 13:24    Procedures .Marland Kitchen.Laceration Repair Date/Time: 07/21/2016 3:06 PM Performed by: Emi HolesLAW, Everly Rubalcava M Authorized by: Emi HolesLAW, Leoni Goodness M   Consent:    Consent obtained:  Verbal   Consent given by:  Patient Anesthesia (see MAR for exact dosages):    Anesthesia method:  Nerve block   Block anesthetic:  Lidocaine 1% w/o epi   Block injection procedure:  Anatomic landmarks identified   Block outcome:  Anesthesia achieved Laceration details:    Location:  Finger   Finger location:  L small finger   Length (cm):  1 Repair type:    Repair type:  Simple Pre-procedure details:    Preparation:  Patient was prepped and draped in usual sterile fashion Exploration:    Hemostasis achieved with:  Direct pressure Treatment:    Area cleansed with:  Betadine   Amount of cleaning:  Standard   Irrigation solution:  Sterile saline   Irrigation method:  Syringe Skin repair:    Repair method:  Sutures  Suture size:  5-0   Wound skin closure material used: Ethilon.   Suture technique:  Simple interrupted   Number of sutures:  5 Approximation:    Approximation:  Close   Vermilion border: well-aligned   Post-procedure details:    Dressing:  Antibiotic ointment and tube gauze   Patient tolerance of procedure:  Tolerated well, no immediate complications    (including critical care time)  DIAGNOSTIC STUDIES: Oxygen Saturation is 98% on RA, normal by my interpretation.    COORDINATION OF CARE: 2:02 PM Discussed treatment plan with pt at bedside and pt agreed to plan.  Medications Ordered in ED Medications  lidocaine (PF) (XYLOCAINE) 1 % injection 30 mL (not administered)  Tdap (BOOSTRIX) injection 0.5 mL (0.5 mLs Intramuscular Given 07/21/16 1431)  lidocaine (XYLOCAINE) 1 % (with pres) injection (20 mLs  Given 07/21/16 1431)     Initial Impression / Assessment and Plan / ED Course  I have reviewed the triage vital signs and the nursing  notes.  Pertinent labs & imaging results that were available during my care of the patient were reviewed by me and considered in my medical decision making (see chart for details).  Tetanus updated in ED. Laceration occurred < 12 hours prior to repair. Nail very superficially involved, no disruption or bleeding from nail bed. No indication for nail removal or repair at this time. Discussed laceration care with pt and answered questions. Pt to f-u for suture removal in 10-14 days and wound check sooner should there be signs of dehiscence or infection. Pt is hemodynamically stable with no complaints prior to dc.  Patient understands and agrees with plan. Patient vitals stable throughout ED course and discharged in satisfactory condition.   Final Clinical Impressions(s) / ED Diagnoses   Final diagnoses:  Laceration of left little finger without foreign body with damage to nail, initial encounter    New Prescriptions New Prescriptions   IBUPROFEN (ADVIL,MOTRIN) 800 MG TABLET    Take 1 tablet (800 mg total) by mouth every 6 (six) hours as needed.   I personally performed the services described in this documentation, which was scribed in my presence. The recorded information has been reviewed and is accurate.    Emi Holes, PA-C 07/21/16 1550    Jacalyn Lefevre, MD 07/21/16 [phone]

## 2016-07-21 NOTE — ED Triage Notes (Signed)
Pt c/o left fifth finger laceration onset 40 minutes ago after cutting self with clean kitchen knife. Tetanus vaccination unknown.

## 2016-07-21 NOTE — Discharge Instructions (Signed)
Treatment: Keep your wound dry and dressing applied until this time tomorrow. After 24 hours, you may wash with warm soapy water. Dry and apply antibiotic ointment and clean dressing. Do this daily until your sutures are removed. ° °Follow-up: Please follow-up with your primary care provider or return to emergency department in 10-14 days for suture removal. Be aware of signs of infection: fever, increasing pain, redness, swelling, drainage from the area. Please call your primary care provider or return to emergency department if you develop any of these symptoms or if any of the sutures come out prior to removal. Please return to the emergency department if you develop any other new or worsening symptoms. ° °

## 2016-07-29 ENCOUNTER — Other Ambulatory Visit (INDEPENDENT_AMBULATORY_CARE_PROVIDER_SITE_OTHER): Payer: Self-pay | Admitting: Orthopaedic Surgery

## 2016-07-29 DIAGNOSIS — M4722 Other spondylosis with radiculopathy, cervical region: Secondary | ICD-10-CM

## 2016-08-20 ENCOUNTER — Telehealth (INDEPENDENT_AMBULATORY_CARE_PROVIDER_SITE_OTHER): Payer: Self-pay | Admitting: Orthopaedic Surgery

## 2016-08-20 NOTE — Telephone Encounter (Signed)
Pt brought 25.00 check for additional form per req of rep from [employer] to complete forms.

## 2016-08-29 NOTE — Pre-Procedure Instructions (Signed)
Bob Mueller  08/29/2016      [facility] [address]- [place], [place] - [id number] [place] [address] Phone: [phone] Fax: [phone]  [facility] [address] [facility] [place] - [address] [place] [address] Phone: [phone] Fax: [phone]    Your procedure is scheduled on August 1  [facility].M.  Call this number if you have problems the morning of surgery:  [phone]   Remember:  Do not eat food or drink liquids after midnight.   Take these medicines the morning of surgery with A SIP OF WATER NONE  7 days prior to surgery STOP taking any Aspirin, Aleve, Naproxen, Ibuprofen, Motrin, Advil, Goody's, BC's, all herbal medications, fish oil, and all vitamins    Do not wear jewelry  Do not wear lotions, powders, or cologne, or deoderant.  Men may shave face and neck.  Do not bring valuables to the hospital.  [facility] is not responsible for any belongings or valuables.  Contacts, dentures or bridgework may not be worn into surgery.  Leave your suitcase in the car.  After surgery it may be brought to your room.  For patients admitted to the hospital, discharge time will be determined by your treatment team.  Patients discharged the day of surgery will not be allowed to drive home.    Special instructions:   [facility]- Preparing For Surgery  Before surgery, you can play an important role. Because skin is not sterile, your skin needs to be as free of germs as possible. You can reduce the number of germs on your skin by washing with CHG (chlorahexidine gluconate) Soap before surgery.  CHG is an antiseptic cleaner which kills germs and bonds with the skin to continue killing germs even after washing.  Please do not use if you have an allergy to CHG or antibacterial soaps. If your skin becomes reddened/irritated stop using  the CHG.  Do not shave (including legs and underarms) for at least 48 hours prior to first CHG shower. It is OK to shave your face.  Please follow these instructions carefully.   1. Shower the NIGHT BEFORE SURGERY and the MORNING OF SURGERY with CHG.   2. If you chose to wash your hair, wash your hair first as usual with your normal shampoo.  3. After you shampoo, rinse your hair and body thoroughly to remove the shampoo.  4. Use CHG as you would any other liquid soap. You can apply CHG directly to the skin and wash gently with a scrungie or a clean washcloth.   5. Apply the CHG Soap to your body ONLY FROM THE NECK DOWN.  Do not use on open wounds or open sores. Avoid contact with your eyes, ears, mouth and genitals (private parts). Wash genitals (private parts) with your normal soap.  6. Wash thoroughly, paying special attention to the area where your surgery will be performed.  7. Thoroughly rinse your body with warm water from the neck down.  8. DO NOT shower/wash with your normal soap after using and rinsing off the CHG Soap.  9. Pat yourself dry with a CLEAN TOWEL.   10. Wear CLEAN PAJAMAS   11. Place CLEAN SHEETS on your bed the night of your first shower and DO NOT SLEEP WITH PETS.    Day of Surgery: Do not apply any deodorants/lotions. Please wear clean  clothes to the hospital/surgery center.      Please read over the following fact sheets that you were given.

## 2016-08-30 ENCOUNTER — Ambulatory Visit (HOSPITAL_COMMUNITY)
Admission: RE | Admit: 2016-08-30 | Discharge: 2016-08-30 | Disposition: A | Discharge status: Home / Self Care | Payer: BLUE CROSS/BLUE SHIELD | Source: Ambulatory Visit | Attending: Orthopaedic Surgery | Admitting: Orthopaedic Surgery

## 2016-08-30 ENCOUNTER — Encounter (HOSPITAL_COMMUNITY)
Admission: RE | Admit: 2016-08-30 | Discharge: 2016-08-30 | Disposition: A | Discharge status: Home / Self Care | Payer: BLUE CROSS/BLUE SHIELD | Source: Ambulatory Visit | Attending: Orthopaedic Surgery | Admitting: Orthopaedic Surgery

## 2016-08-30 ENCOUNTER — Encounter (HOSPITAL_COMMUNITY): Payer: Self-pay

## 2016-08-30 DIAGNOSIS — Z01810 Encounter for preprocedural cardiovascular examination: Secondary | ICD-10-CM | POA: Diagnosis not present

## 2016-08-30 DIAGNOSIS — Z01812 Encounter for preprocedural laboratory examination: Secondary | ICD-10-CM | POA: Diagnosis present

## 2016-08-30 DIAGNOSIS — M47892 Other spondylosis, cervical region: Secondary | ICD-10-CM | POA: Diagnosis not present

## 2016-08-30 DIAGNOSIS — M47812 Spondylosis without myelopathy or radiculopathy, cervical region: Secondary | ICD-10-CM

## 2016-08-30 DIAGNOSIS — R001 Bradycardia, unspecified: Secondary | ICD-10-CM | POA: Diagnosis not present

## 2016-08-30 HISTORY — DX: Headache: R51

## 2016-08-30 HISTORY — DX: Unspecified osteoarthritis, unspecified site: M19.90

## 2016-08-30 HISTORY — DX: Headache, unspecified: R51.9

## 2016-08-30 LAB — URINALYSIS, ROUTINE W REFLEX MICROSCOPIC
Bilirubin Urine: NEGATIVE
GLUCOSE, UA: NEGATIVE mg/dL
Hgb urine dipstick: NEGATIVE
Ketones, ur: 5 mg/dL — AB
LEUKOCYTES UA: NEGATIVE
NITRITE: NEGATIVE
PROTEIN: NEGATIVE mg/dL
Specific Gravity, Urine: 1.023 (ref 1.005–1.030)
pH: 7 (ref 5.0–8.0)

## 2016-08-30 LAB — COMPREHENSIVE METABOLIC PANEL
ALBUMIN: 3.8 g/dL (ref 3.5–5.0)
ALT: 18 U/L (ref 17–63)
ANION GAP: 6 (ref 5–15)
AST: 23 U/L (ref 15–41)
Alkaline Phosphatase: 41 U/L (ref 38–126)
BILIRUBIN TOTAL: 0.5 mg/dL (ref 0.3–1.2)
BUN: 15 mg/dL (ref 6–20)
CO2: 26 mmol/L (ref 22–32)
Calcium: 8.9 mg/dL (ref 8.9–10.3)
Chloride: 106 mmol/L (ref 101–111)
Creatinine, Ser: 0.76 mg/dL (ref 0.61–1.24)
GFR calc non Af Amer: >60 mL/min (ref >60)
GLUCOSE: 96 mg/dL (ref 65–99)
POTASSIUM: 4.6 mmol/L (ref 3.5–5.1)
SODIUM: 138 mmol/L (ref 135–145)
TOTAL PROTEIN: 6.1 g/dL — AB (ref 6.5–8.1)

## 2016-08-30 LAB — CBC
HEMATOCRIT: 40.7 % (ref 39.0–52.0)
Hemoglobin: 13.8 g/dL (ref 13.0–17.0)
MCH: 30.5 pg (ref 26.0–34.0)
MCHC: 33.9 g/dL (ref 30.0–36.0)
MCV: 90 fL (ref 78.0–100.0)
Platelets: 217 10*3/uL (ref 150–400)
RBC: 4.52 MIL/uL (ref 4.22–5.81)
RDW: 13 % (ref 11.5–15.5)
WBC: 8 10*3/uL (ref 4.0–10.5)

## 2016-08-30 LAB — SURGICAL PCR SCREEN
MRSA, PCR: NEGATIVE
Staphylococcus aureus: POSITIVE — AB

## 2016-08-30 NOTE — Progress Notes (Signed)
[facility], when he needs to. No particular MD that he sees.  Cardiologist -  No cardiologist  Chest x-ray - 08/30/2016  EKG - 08/30/2016  Stress Test - 2003 in epic  Pt states he was told that he had a couple of blockages and had to take a nitro in 2003 when [facility]. Pt states they did a stress test. Pt reports no problems since then and no cardiac follow up.   Patient denies shortness of breath, fever, cough and chest pain at PAT appointment   Patient verbalized understanding of instructions that were given to them at the PAT appointment. Patient was also instructed that they will need to review over the PAT instructions again at home before surgery.

## 2016-08-30 NOTE — Progress Notes (Signed)
Nasal PCR positive for MSSA patient notified and prescription called to pharmacy

## 2016-09-02 ENCOUNTER — Encounter (HOSPITAL_COMMUNITY): Payer: Self-pay

## 2016-09-02 NOTE — Progress Notes (Addendum)
Anesthesia Chart Review:  Pt is a [age] year old male scheduled for C5-6, C6-7 ACDF on 09/04/2016 with Annell GreeningMark Yates, MD  PMH includes:  Skull fracture. Current smoker. BMI 20.5  Medications reviewed.   BP 101/73   Pulse 80   Temp 36.8 C   Resp 20   Ht 5' 10.5" (1.791 m)   Wt 145 lb 4.8 oz (65.9 kg)   SpO2 99%   BMI 20.55 kg/m   Preoperative labs reviewed.    CXR 08/30/16: No acute cardiopulmonary disease.  EKG 08/30/16: sinus bradycardia (59 bpm)  CT angio chest, abd, pelvis 02/27/15 1. No acute vascular findings. 2. LAD coronary artery atherosclerosis. 3. 7 by 4 mm nodule along the left major fissure, likely a subpleural lymph node. If the patient is at high risk for bronchogenic carcinoma, follow-up chest CT at 6-12 months is recommended. If the patient is at low risk for bronchogenic carcinoma, follow-up chest CT at 12 months is recommended.  4. Slight right eccentric wedging and T8 seems chronic. - I spoke with pt by telephone.  He was unaware of pulmonary nodule and need for f/u.  He will engage a PCP and get f/u testing.    Nuclear stress test 2003 (done for chest pain that pt reports turned out to be anxiety):  1. No fixed or reversible defect seen to suggest ischemia or infarction. 2. EF 61%.  If no changes, I anticipate pt can proceed with surgery as scheduled.   Rica Mastngela Chesni Vos, FNP-BC [facility]iology Phone: [phone] 09/03/2016 9:09 AM

## 2016-09-04 ENCOUNTER — Ambulatory Visit (HOSPITAL_COMMUNITY): Payer: BLUE CROSS/BLUE SHIELD | Admitting: Emergency Medicine

## 2016-09-04 ENCOUNTER — Ambulatory Visit (HOSPITAL_COMMUNITY): Payer: BLUE CROSS/BLUE SHIELD

## 2016-09-04 ENCOUNTER — Ambulatory Visit (HOSPITAL_COMMUNITY): Payer: BLUE CROSS/BLUE SHIELD | Admitting: Anesthesiology

## 2016-09-04 ENCOUNTER — Observation Stay (HOSPITAL_COMMUNITY)
Admission: RE | Admit: 2016-09-04 | Discharge: 2016-09-05 | Disposition: A | Discharge status: Home / Self Care | Payer: BLUE CROSS/BLUE SHIELD | Source: Ambulatory Visit | Attending: Orthopaedic Surgery | Admitting: Orthopaedic Surgery

## 2016-09-04 ENCOUNTER — Encounter (HOSPITAL_COMMUNITY): Payer: Self-pay | Admitting: Anesthesiology

## 2016-09-04 ENCOUNTER — Encounter (HOSPITAL_COMMUNITY)
Admission: RE | Disposition: A | Discharge status: Home / Self Care | Payer: Self-pay | Source: Ambulatory Visit | Attending: Orthopaedic Surgery

## 2016-09-04 DIAGNOSIS — M4722 Other spondylosis with radiculopathy, cervical region: Secondary | ICD-10-CM

## 2016-09-04 DIAGNOSIS — M4802 Spinal stenosis, cervical region: Secondary | ICD-10-CM | POA: Insufficient documentation

## 2016-09-04 DIAGNOSIS — Z419 Encounter for procedure for purposes other than remedying health state, unspecified: Secondary | ICD-10-CM

## 2016-09-04 DIAGNOSIS — M1990 Unspecified osteoarthritis, unspecified site: Secondary | ICD-10-CM | POA: Diagnosis not present

## 2016-09-04 DIAGNOSIS — F17290 Nicotine dependence, other tobacco product, uncomplicated: Secondary | ICD-10-CM | POA: Diagnosis not present

## 2016-09-04 DIAGNOSIS — M50122 Cervical disc disorder at C5-C6 level with radiculopathy: Principal | ICD-10-CM | POA: Insufficient documentation

## 2016-09-04 DIAGNOSIS — M47812 Spondylosis without myelopathy or radiculopathy, cervical region: Secondary | ICD-10-CM | POA: Diagnosis not present

## 2016-09-04 DIAGNOSIS — M5020 Other cervical disc displacement, unspecified cervical region: Secondary | ICD-10-CM | POA: Diagnosis present

## 2016-09-04 HISTORY — PX: ANTERIOR CERVICAL DECOMP/DISCECTOMY FUSION: SHX1161

## 2016-09-04 SURGERY — ANTERIOR CERVICAL DECOMPRESSION/DISCECTOMY FUSION 2 LEVELS
Anesthesia: General | Site: Spine Cervical

## 2016-09-04 MED ORDER — BUPIVACAINE-EPINEPHRINE (PF) 0.25% -1:200000 IJ SOLN
INTRAMUSCULAR | Status: AC
  Filled 2016-09-04: qty 30

## 2016-09-04 MED ORDER — LACTATED RINGERS IV SOLN
INTRAVENOUS | Status: DC | PRN
  Administered 2016-09-04 (×2): via INTRAVENOUS

## 2016-09-04 MED ORDER — MIDAZOLAM HCL 5 MG/5ML IJ SOLN
INTRAMUSCULAR | Status: DC | PRN
  Administered 2016-09-04: 2 mg via INTRAVENOUS

## 2016-09-04 MED ORDER — CEFAZOLIN SODIUM-DEXTROSE 2-4 GM/100ML-% IV SOLN
2.0000 g | INTRAVENOUS | Status: AC
  Administered 2016-09-04: 2 g via INTRAVENOUS
  Filled 2016-09-04: qty 100

## 2016-09-04 MED ORDER — ONDANSETRON HCL 4 MG/2ML IJ SOLN
INTRAMUSCULAR | Status: DC | PRN
  Administered 2016-09-04: 4 mg via INTRAVENOUS

## 2016-09-04 MED ORDER — METHOCARBAMOL 500 MG PO TABS
500.0000 mg | ORAL_TABLET | Freq: Four times a day (QID) | ORAL | Status: DC | PRN
  Administered 2016-09-05: 500 mg via ORAL
  Filled 2016-09-04: qty 1

## 2016-09-04 MED ORDER — PROPOFOL 10 MG/ML IV BOLUS
INTRAVENOUS | Status: DC | PRN
  Administered 2016-09-04: 180 mg via INTRAVENOUS

## 2016-09-04 MED ORDER — SODIUM CHLORIDE 0.9 % IV SOLN: INTRAVENOUS | Status: DC

## 2016-09-04 MED ORDER — ONDANSETRON HCL 4 MG/2ML IJ SOLN: 4.0000 mg | Freq: Four times a day (QID) | INTRAMUSCULAR | Status: DC | PRN

## 2016-09-04 MED ORDER — HEMOSTATIC AGENTS (NO CHARGE) OPTIME
TOPICAL | Status: DC | PRN
  Administered 2016-09-04: 1 via TOPICAL

## 2016-09-04 MED ORDER — FENTANYL CITRATE (PF) 100 MCG/2ML IJ SOLN
INTRAMUSCULAR | Status: DC | PRN
  Administered 2016-09-04: 150 ug via INTRAVENOUS
  Administered 2016-09-04 (×2): 50 ug via INTRAVENOUS

## 2016-09-04 MED ORDER — EPHEDRINE SULFATE 50 MG/ML IJ SOLN
INTRAMUSCULAR | Status: DC | PRN
  Administered 2016-09-04 (×2): 10 mg via INTRAVENOUS

## 2016-09-04 MED ORDER — HYDROXYZINE HCL 50 MG/ML IM SOLN
50.0000 mg | Freq: Four times a day (QID) | INTRAMUSCULAR | Status: DC | PRN
  Administered 2016-09-04: 50 mg via INTRAMUSCULAR
  Filled 2016-09-04: qty 1

## 2016-09-04 MED ORDER — MENTHOL 3 MG MT LOZG: 1.0000 | LOZENGE | OROMUCOSAL | Status: DC | PRN

## 2016-09-04 MED ORDER — LACTATED RINGERS IV SOLN: INTRAVENOUS | Status: DC

## 2016-09-04 MED ORDER — PHENOL 1.4 % MT LIQD: 1.0000 | OROMUCOSAL | Status: DC | PRN

## 2016-09-04 MED ORDER — FENTANYL CITRATE (PF) 100 MCG/2ML IJ SOLN
INTRAMUSCULAR | Status: AC
  Filled 2016-09-04: qty 2

## 2016-09-04 MED ORDER — CEFAZOLIN SODIUM-DEXTROSE 1-4 GM/50ML-% IV SOLN
1.0000 g | Freq: Three times a day (TID) | INTRAVENOUS | Status: AC
  Administered 2016-09-04: 1 g via INTRAVENOUS
  Filled 2016-09-04: qty 50

## 2016-09-04 MED ORDER — DEXAMETHASONE SODIUM PHOSPHATE 10 MG/ML IJ SOLN
INTRAMUSCULAR | Status: DC | PRN
  Administered 2016-09-04: 10 mg via INTRAVENOUS

## 2016-09-04 MED ORDER — SODIUM CHLORIDE 0.9% FLUSH: 3.0000 mL | INTRAVENOUS | Status: DC | PRN

## 2016-09-04 MED ORDER — METOCLOPRAMIDE HCL 5 MG/ML IJ SOLN: 10.0000 mg | Freq: Once | INTRAMUSCULAR | Status: DC | PRN

## 2016-09-04 MED ORDER — MIDAZOLAM HCL 2 MG/2ML IJ SOLN
INTRAMUSCULAR | Status: AC
  Filled 2016-09-04: qty 2

## 2016-09-04 MED ORDER — OXYCODONE HCL 5 MG PO TABS
5.0000 mg | ORAL_TABLET | ORAL | Status: DC | PRN
  Administered 2016-09-04 – 2016-09-05 (×4): 10 mg via ORAL
  Filled 2016-09-04 (×4): qty 2

## 2016-09-04 MED ORDER — ONDANSETRON HCL 4 MG PO TABS: 4.0000 mg | ORAL_TABLET | Freq: Four times a day (QID) | ORAL | Status: DC | PRN

## 2016-09-04 MED ORDER — ACETAMINOPHEN 325 MG PO TABS: 650.0000 mg | ORAL_TABLET | ORAL | Status: DC | PRN

## 2016-09-04 MED ORDER — SODIUM CHLORIDE 0.9% FLUSH: 3.0000 mL | Freq: Two times a day (BID) | INTRAVENOUS | Status: DC

## 2016-09-04 MED ORDER — 0.9 % SODIUM CHLORIDE (POUR BTL) OPTIME
TOPICAL | Status: DC | PRN
  Administered 2016-09-04: 1000 mL

## 2016-09-04 MED ORDER — ROCURONIUM BROMIDE 100 MG/10ML IV SOLN
INTRAVENOUS | Status: DC | PRN
  Administered 2016-09-04: 10 mg via INTRAVENOUS
  Administered 2016-09-04: 50 mg via INTRAVENOUS

## 2016-09-04 MED ORDER — SUGAMMADEX SODIUM 200 MG/2ML IV SOLN
INTRAVENOUS | Status: DC | PRN
  Administered 2016-09-04: 131.8 mg via INTRAVENOUS

## 2016-09-04 MED ORDER — ACETAMINOPHEN 650 MG RE SUPP
650.0000 mg | RECTAL | Status: DC | PRN
Start: 2016-09-04 — End: 2016-09-05

## 2016-09-04 MED ORDER — CHLORHEXIDINE GLUCONATE 4 % EX LIQD: 60.0000 mL | Freq: Once | CUTANEOUS | Status: DC

## 2016-09-04 MED ORDER — FENTANYL CITRATE (PF) 250 MCG/5ML IJ SOLN
INTRAMUSCULAR | Status: AC
  Filled 2016-09-04: qty 5

## 2016-09-04 MED ORDER — MEPERIDINE HCL 25 MG/ML IJ SOLN: 6.2500 mg | INTRAMUSCULAR | Status: DC | PRN

## 2016-09-04 MED ORDER — FENTANYL CITRATE (PF) 100 MCG/2ML IJ SOLN
25.0000 ug | INTRAMUSCULAR | Status: DC | PRN
  Administered 2016-09-04 (×2): 50 ug via INTRAVENOUS

## 2016-09-04 MED ORDER — LIDOCAINE HCL (CARDIAC) 20 MG/ML IV SOLN
INTRAVENOUS | Status: DC | PRN
  Administered 2016-09-04: 50 mg via INTRAVENOUS

## 2016-09-04 MED ORDER — POLYETHYLENE GLYCOL 3350 17 G PO PACK
17.0000 g | PACK | Freq: Every day | ORAL | Status: DC
  Administered 2016-09-05: 17 g via ORAL
  Filled 2016-09-04: qty 1

## 2016-09-04 MED ORDER — HYDROMORPHONE HCL 1 MG/ML IJ SOLN
0.5000 mg | INTRAMUSCULAR | Status: DC | PRN
  Administered 2016-09-04: 0.5 mg via INTRAVENOUS
  Filled 2016-09-04: qty 0.5

## 2016-09-04 MED ORDER — BUPIVACAINE-EPINEPHRINE 0.25% -1:200000 IJ SOLN
INTRAMUSCULAR | Status: DC | PRN
  Administered 2016-09-04: 10 mL

## 2016-09-04 MED ORDER — LACTATED RINGERS IV SOLN
INTRAVENOUS | Status: DC
  Administered 2016-09-04: 11:00:00 via INTRAVENOUS

## 2016-09-04 MED ORDER — SODIUM CHLORIDE 0.9 % IV SOLN: 250.0000 mL | INTRAVENOUS | Status: DC

## 2016-09-04 MED ORDER — METHOCARBAMOL 1000 MG/10ML IJ SOLN
500.0000 mg | Freq: Four times a day (QID) | INTRAVENOUS | Status: DC | PRN
  Filled 2016-09-04: qty 5

## 2016-09-04 MED ORDER — DOCUSATE SODIUM 100 MG PO CAPS
100.0000 mg | ORAL_CAPSULE | Freq: Two times a day (BID) | ORAL | Status: DC
  Administered 2016-09-04 – 2016-09-05 (×2): 100 mg via ORAL
  Filled 2016-09-04 (×2): qty 1

## 2016-09-04 SURGICAL SUPPLY — 59 items
APL SKNCLS STERI-STRIP NONHPOA (GAUZE/BANDAGES/DRESSINGS) ×1
BENZOIN TINCTURE PRP APPL 2/3 (GAUZE/BANDAGES/DRESSINGS) ×3 IMPLANT
BIT DRILL SRG 14X2.2XFLT CHK (BIT) IMPLANT
BIT DRL SRG 14X2.2XFLT CHK (BIT) ×1
BLADE CLIPPER SURG (BLADE) IMPLANT
BONE CERV LORDOTIC 14.5X12X6 (Bone Implant) ×6 IMPLANT
BUR ROUND FLUTED 4 SOFT TCH (BURR) IMPLANT
BUR ROUND FLUTED 4MM SOFT TCH (BURR)
CLOSURE STERI-STRIP 1/2X4 (GAUZE/BANDAGES/DRESSINGS) ×1
CLOSURE WOUND 1/2 X4 (GAUZE/BANDAGES/DRESSINGS) ×1
CLSR STERI-STRIP ANTIMIC 1/2X4 (GAUZE/BANDAGES/DRESSINGS) ×1 IMPLANT
COLLAR CERV LO CONTOUR FIRM DE (SOFTGOODS) ×2 IMPLANT
CORDS BIPOLAR (ELECTRODE) ×3 IMPLANT
COVER MAYO STAND STRL (DRAPES) ×4 IMPLANT
COVER SURGICAL LIGHT HANDLE (MISCELLANEOUS) ×3 IMPLANT
CRADLE DONUT ADULT HEAD (MISCELLANEOUS) ×3 IMPLANT
DRAPE C-ARM 42X72 X-RAY (DRAPES) ×3 IMPLANT
DRAPE HALF SHEET 40X57 (DRAPES) ×3 IMPLANT
DRAPE MICROSCOPE LEICA (MISCELLANEOUS) ×3 IMPLANT
DRILL BIT SKYLINE 14MM (BIT) ×3
DURAPREP 6ML APPLICATOR 50/CS (WOUND CARE) ×3 IMPLANT
ELECT COATED BLADE 2.86 ST (ELECTRODE) ×3 IMPLANT
ELECT REM PT RETURN 9FT ADLT (ELECTROSURGICAL) ×3
ELECTRODE REM PT RTRN 9FT ADLT (ELECTROSURGICAL) ×1 IMPLANT
EVACUATOR 1/8 PVC DRAIN (DRAIN) ×3 IMPLANT
GAUZE SPONGE 4X4 12PLY STRL (GAUZE/BANDAGES/DRESSINGS) ×3 IMPLANT
GLOVE BIOGEL PI IND STRL 8 (GLOVE) ×2 IMPLANT
GLOVE BIOGEL PI INDICATOR 8 (GLOVE) ×4
GLOVE ORTHO TXT STRL SZ7.5 (GLOVE) ×6 IMPLANT
GOWN STRL REUS W/ TWL LRG LVL3 (GOWN DISPOSABLE) ×1 IMPLANT
GOWN STRL REUS W/ TWL XL LVL3 (GOWN DISPOSABLE) ×1 IMPLANT
GOWN STRL REUS W/TWL 2XL LVL3 (GOWN DISPOSABLE) ×3 IMPLANT
GOWN STRL REUS W/TWL LRG LVL3 (GOWN DISPOSABLE) ×9
GOWN STRL REUS W/TWL XL LVL3 (GOWN DISPOSABLE) ×3
GRAFT BNE SPCR VG2 14.5X12X6 (Bone Implant) IMPLANT
HEAD HALTER (SOFTGOODS) ×3 IMPLANT
HEMOSTAT SURGICEL 2X14 (HEMOSTASIS) IMPLANT
KIT BASIN OR (CUSTOM PROCEDURE TRAY) ×3 IMPLANT
KIT ROOM TURNOVER OR (KITS) ×3 IMPLANT
MANIFOLD NEPTUNE II (INSTRUMENTS) IMPLANT
NDL 25GX 5/8IN NON SAFETY (NEEDLE) ×1 IMPLANT
NEEDLE 25GX 5/8IN NON SAFETY (NEEDLE) ×3 IMPLANT
NS IRRIG 1000ML POUR BTL (IV SOLUTION) ×3 IMPLANT
PACK ORTHO CERVICAL (CUSTOM PROCEDURE TRAY) ×3 IMPLANT
PAD ARMBOARD 7.5X6 YLW CONV (MISCELLANEOUS) ×6 IMPLANT
PATTIES SURGICAL .5 X.5 (GAUZE/BANDAGES/DRESSINGS) IMPLANT
PIN TEMP SKYLINE THREADED (PIN) ×2 IMPLANT
PLATE TWO LEVEL SKYLINE 30MM (Plate) ×2 IMPLANT
RESTRAINT LIMB HOLDER UNIV (RESTRAINTS) ×3 IMPLANT
SCREW VAR SELF TAP SKYLINE 14M (Screw) ×18 IMPLANT
STRIP CLOSURE SKIN 1/2X4 (GAUZE/BANDAGES/DRESSINGS) ×2 IMPLANT
SURGIFLO W/THROMBIN 8M KIT (HEMOSTASIS) ×2 IMPLANT
SUT BONE WAX W31G (SUTURE) ×3 IMPLANT
SUT VIC AB 3-0 X1 27 (SUTURE) ×3 IMPLANT
SUT VICRYL 4-0 PS2 18IN ABS (SUTURE) ×6 IMPLANT
TAPE CLOTH SURG 4X10 WHT LF (GAUZE/BANDAGES/DRESSINGS) ×2 IMPLANT
TOWEL OR 17X24 6PK STRL BLUE (TOWEL DISPOSABLE) ×3 IMPLANT
TOWEL OR 17X26 10 PK STRL BLUE (TOWEL DISPOSABLE) ×3 IMPLANT
TRAY FOLEY CATH SILVER 16FR (SET/KITS/TRAYS/PACK) IMPLANT

## 2016-09-04 NOTE — Op Note (Addendum)
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  Surgeon: Annell GreeningMark Yates M.D.  Assistant: Zonia KiefJames Owens PA-C, medically necessary and present for the entire procedure  Anesthesia: Gen. orotracheal  Drains one Hemovac neck  EBL: Per anesthetic record  Implants Depew skyline plate 30 mm. 14 mm screws 6. The VG 26 mm corticocancellous allograft 2.  Procedure after induction general anesthesia standard prepping and draping with headholder traction applied without weight arms tucked at the sides with pads over the ulnar nerve wrist restraints not used during the case next prepped with DuraPrep sterile skin marker on the plan incision starting at the midline extending the left and then Betadine Steri-Drape after the area been squared with towels sterile Mayo stand at the head and thyroid sheet and drapes. Timeout procedure completed Ancef given prophylactically. Patient started the midline extending to the left platysma was divided in line with the fibers with Bovie electrocautery. Blunt dissection below the omohyoid with carotid sheath incontinence lateral down to the longus Coley prominent spur at C6-7 was noted short 25 needle placed with straight clamp confirmed with draped lateral C-arm. Discectomy was performed after self-tapping retractors placed. Progression back to the posterior cortex with hand Cloward curettes and use of the 4 mm bur. Endplates were prepared posterior longitudinal ligament was taken down dura was decompressed and compressing spurs removed as well as uncovertebral joint scraped right and left. Trial sizer showed a 6 mm graft gave a good fit. Pulling by the CRNA with had halter traction graft was countersunk 2 mm. Anterior spurs removed and identical procedure was repeated at the C5-6 level which showed a 1 mm space posteriorly with overhanging spurs from the C5  vertebral body overlying the disc space. These were decompressed with microdissection with operative microscope similarly to C6-7 taking down the posterior longitudinal ligament decompressing the dura and then a 6 mm graft also gave good fit. Countersunk 2 mm spurs removed anteriorly and a 30 mm plate was selected held with a single spike checked under fluoroscopy adjusted and then screws were placed checked again under fluoroscopy and final screw lock with the tiny final screw lock tightener locking down the 14 mm screws 6. Hemovac was placed with and out technique on the left side. Some surgical flow at been using the epidural space prior place and the graft and altered flow was removed with the dry epidural space. Operative field was dry copiously irrigated Hemovac placement and out technique platysma closed with 3-0 Vicryl. 4-0 Vicryl subarticular closure tincture benzoin Steri-Strips 4 x 4's and tape and soft cervical collar instrument count needle count was correct. Patient was transferred her care room and was neurologically intact.

## 2016-09-04 NOTE — Anesthesia Procedure Notes (Signed)
Procedure Name: Intubation Date/Time: 09/04/2016 1:21 PM Performed by: Gwenyth AllegraADAMI, Casimer Russett Pre-anesthesia Checklist: Patient identified, Emergency Drugs available, Suction available, Patient being monitored and Timeout performed Patient Re-evaluated:Patient Re-evaluated prior to induction Oxygen Delivery Method: Circle system utilized Preoxygenation: Pre-oxygenation with 100% oxygen Induction Type: IV induction Laryngoscope Size: 4 and Glidescope Grade View: Grade I Tube type: Oral Number of attempts: 1 Airway Equipment and Method: Stylet and Video-laryngoscopy Placement Confirmation: ETT inserted through vocal cords under direct vision,  positive ETCO2 and breath sounds checked- equal and bilateral Dental Injury: Teeth and Oropharynx as per pre-operative assessment

## 2016-09-04 NOTE — H&P (Signed)
Bob Mueller is an 56 y.o. male.   Chief Complaint: Neck pain and bilateral upper extremity radiculopathy. 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 Progressively worsening symptoms. Failed conservative treatment.  Past Medical History:  Diagnosis Date  . Arthritis   . Headache    migraines  . Skull fracture [facility](HCC)     Past Surgical History:  Procedure Laterality Date  . TONSILLECTOMY      No family history on file. Social History:  reports that he has been smoking Cigars.  He has never used smokeless tobacco. He reports that he drinks alcohol. He reports that he uses drugs, including Marijuana, about 3 times per week.  Allergies: No Known Allergies  Medications Prior to Admission  Medication Sig Dispense Refill  . Aspirin-Acetaminophen-Caffeine (GOODYS EXTRA STRENGTH) 520-260-32.5 MG PACK Take 1 packet by mouth daily as needed (for pain.).    Marland Kitchen. b complex vitamins tablet Take 1 tablet by mouth daily.    . hydrocortisone cream 1 % Apply 1 application topically 3 (three) times daily as needed (for itchy/dry skin).    Marland Kitchen. ibuprofen (ADVIL,MOTRIN) 200 MG tablet Take 600 mg by mouth every 6 (six) hours as needed (for pain.).    Marland Kitchen. ibuprofen (ADVIL,MOTRIN) 800 MG tablet Take 1 tablet (800 mg total) by mouth every 6 (six) hours as needed. (Patient taking differently: Take 800 mg by mouth every 6 (six) hours as needed (for pain.). ) 21 tablet 0  . Multiple Vitamin (MULTIVITAMIN WITH MINERALS) TABS tablet Take 1 tablet by mouth daily.    . Turmeric 500 MG TABS Take 500 mg by mouth daily.    . cyclobenzaprine (FLEXERIL) 10 MG tablet Take 10 mg by mouth 3 (three) times daily as needed for muscle spasms.    . fluticasone (FLONASE) 50 MCG/ACT nasal spray Place 1 spray into both nostrils daily. (Patient not taking: Reported on 05/27/2016) 16 g 2    No results found for this or any previous visit (from the past  48 hour(s)). No results found.  Review of Systems  Constitutional: Negative.   HENT: Negative.   Respiratory: Negative.   Cardiovascular: Negative.   Genitourinary: Negative.   Musculoskeletal: Positive for neck pain.  Neurological: Positive for tingling.  Psychiatric/Behavioral: Negative.     There were no vitals taken for this visit. Physical Exam  Constitutional: He is oriented to person, place, and time. No distress.  HENT:  Head: Normocephalic.  Eyes: Pupils are equal, round, and reactive to light. EOM are normal.  Neck: Normal range of motion.  Respiratory: He is in respiratory distress.  GI: He exhibits no distension.  Musculoskeletal:  Ortho Exam patient has positive Spurling on the left increased pain with cervical compression mild relief with distraction. Negative Spurling on the right negative were made. Muscle testing biceps triceps brachioradialis wrist flexion-extension supination pronation finger flexes extensors are normal. Reflexes are 1+ and symmetrical brachial radialis biceps and triceps. Normal heel toe gait no lower extremity hyperreflexia. No clonus. Clots ankle dorsiflexion plantar flexion is strong. Negative impingement right left shoulder no winging of the scapula. Thoracic spine is straight. Negative for shoulder subluxation.   Neurological: He is alert and oriented to person, place, and time.  Skin: Skin is warm and dry.  Psychiatric: He has a normal mood and affect.     Assessment/Plan C5-6 and C6-7 HNP/stenosis. Neck pain and bilateral upper extremity radiculopathy.  We will proceed with C5-6, C6-7  Anterior Cervical Discectomy and Fusion, Allograft, Plate as scheduled. Surgical procedure along with possible risks and complications discussed. All questions answered.  Zonia KiefJames Rawan Riendeau, PA-C 09/04/2016, 10:33 AM

## 2016-09-04 NOTE — Progress Notes (Signed)
Orthopedic Tech Progress Note Patient Details:  Allena KatzChristopher A Wadas 1960-12-23 [id number]  Ortho Devices Type of Ortho Device: Soft collar Ortho Device/Splint Location: at bedside  Ortho Device/Splint Interventions: Casandra DoffingOrdered   Anner Baity Craig 09/04/2016, 5:36 PM

## 2016-09-04 NOTE — Interval H&P Note (Signed)
History and Physical Interval Note:  09/04/2016 12:50 PM  Bob Mueller  has presented today for surgery, with the diagnosis of C5-6, C6-7 Spondylosis, Foraminal Stenosis  The various methods of treatment have been discussed with the patient and family. After consideration of risks, benefits and other options for treatment, the patient has consented to  Procedure(s): C5-6, C6-7 Anterior Cervical Discectomy and Fusion, Allograft, Plate (N/A) as a surgical intervention .  The patient's history has been reviewed, patient examined, no change in status, stable for surgery.  I have reviewed the patient's chart and labs.  Questions were answered to the patient's satisfaction.     Eldred MangesMark C Munachimso Palin

## 2016-09-04 NOTE — Anesthesia Preprocedure Evaluation (Signed)
Anesthesia Evaluation  Patient identified by MRN, date of birth, ID band Patient awake    Reviewed: Allergy & Precautions, NPO status , Patient's Chart, lab work & pertinent test results  Airway Mallampati: II  TM Distance: >3 FB Neck ROM: Full    Dental no notable dental hx. (+) Poor Dentition   Pulmonary Current Smoker,    Pulmonary exam normal breath sounds clear to auscultation       Cardiovascular negative cardio ROS Normal cardiovascular exam Rhythm:Regular Rate:Normal     Neuro/Psych negative neurological ROS  negative psych ROS   GI/Hepatic negative GI ROS, Neg liver ROS,   Endo/Other  negative endocrine ROS  Renal/GU negative Renal ROS  negative genitourinary   Musculoskeletal negative musculoskeletal ROS (+)   Abdominal   Peds negative pediatric ROS (+)  Hematology negative hematology ROS (+)   Anesthesia Other Findings   Reproductive/Obstetrics negative OB ROS                             Anesthesia Physical Anesthesia Plan  ASA: II  Anesthesia Plan: General   Post-op Pain Management:    Induction: Intravenous  PONV Risk Score and Plan: 2 and Ondansetron and Dexamethasone  Airway Management Planned: Oral ETT  Additional Equipment:   Intra-op Plan:   Post-operative Plan: Extubation in OR  Informed Consent: I have reviewed the patients History and Physical, chart, labs and discussed the procedure including the risks, benefits and alternatives for the proposed anesthesia with the patient or authorized representative who has indicated his/her understanding and acceptance.   Dental advisory given  Plan Discussed with: CRNA  Anesthesia Plan Comments:         Anesthesia Quick Evaluation

## 2016-09-04 NOTE — Transfer of Care (Signed)
Immediate Anesthesia Transfer of Care Note  Patient: Bob Mueller  Procedure(s) Performed: Procedure(s): C5-6, C6-7 Anterior Cervical Discectomy and Fusion, Allograft, Plate (N/A)  Patient Location: PACU  Anesthesia Type:General  Level of Consciousness: awake and alert   Airway & Oxygen Therapy: Patient Spontanous Breathing and Patient connected to nasal cannula oxygen  Post-op Assessment: Report given to RN and Post -op Vital signs reviewed and stable  Post vital signs: Reviewed and stable  Last Vitals:  Vitals:   09/04/16 1552 09/04/16 1600  BP: 124/73   Pulse: 75 73  Resp: (!) 9 10  Temp:      Last Pain:  Vitals:   09/04/16 1552  TempSrc:   PainSc: 7          Complications: No apparent anesthesia complications

## 2016-09-05 ENCOUNTER — Encounter (HOSPITAL_COMMUNITY): Payer: Self-pay | Admitting: Orthopaedic Surgery

## 2016-09-05 ENCOUNTER — Telehealth (INDEPENDENT_AMBULATORY_CARE_PROVIDER_SITE_OTHER): Payer: Self-pay | Admitting: Radiology

## 2016-09-05 DIAGNOSIS — M50122 Cervical disc disorder at C5-C6 level with radiculopathy: Secondary | ICD-10-CM | POA: Diagnosis not present

## 2016-09-05 MED ORDER — OXYCODONE-ACETAMINOPHEN 5-325 MG PO TABS: 1.0000 | ORAL_TABLET | Freq: Four times a day (QID) | ORAL | 0 refills | Status: DC | PRN

## 2016-09-05 MED ORDER — METHOCARBAMOL 500 MG PO TABS: 500.0000 mg | ORAL_TABLET | Freq: Four times a day (QID) | ORAL | 0 refills | Status: DC | PRN

## 2016-09-05 NOTE — Progress Notes (Signed)
Subjective: Doing well.  preop arm pain better.  Ambulated in hall without issue.  No dysphagia or dyspnea.   Objective: Vital signs in last 24 hours: Temp:  [97.7 F (36.5 C)-98.3 F (36.8 C)] 98.3 F (36.8 C) (08/02 0810) Pulse Rate:  [67-89] 69 (08/02 0810) Resp:  [9-20] 20 (08/02 0810) BP: (106-125)/(71-89) 111/76 (08/02 0810) SpO2:  [93 %-100 %] 94 % (08/02 0810) Weight:  [145 lb 4.8 oz (65.9 kg)] 145 lb 4.8 oz (65.9 kg) (08/01 1042)  Intake/Output from previous day: 08/01 0701 - 08/02 0700 In: 3060.8 [P.O.:540; I.V.:2520.8] Out: 430 [Urine:350; Drains:30; Blood:50] Intake/Output this shift: Total I/O In: 240 [P.O.:240] Out: -   No results for input(s): HGB in the last 72 hours. No results for input(s): WBC, RBC, HCT, PLT in the last 72 hours. No results for input(s): NA, K, CL, CO2, BUN, CREATININE, GLUCOSE, CALCIUM in the last 72 hours. No results for input(s): LABPT, INR in the last 72 hours.  Exam: Alert and oriented.  NAD.  Wound looks good.  steris intact.  No drainage.  hemovac drain removed.   Assessment/Plan: D/c home today.  Scripts for percocet and robaxin on chart. F/u one week.    Bob Mueller 09/05/2016, 9:20 AM

## 2016-09-05 NOTE — Discharge Instructions (Signed)
Must have cervical collar on at all times except when showering.    Ok to shower 5 days postop.  Do not apply any creams or ointments to incision.   No driving or lifting until further notice.    Do not turn head or flex/extend neck.   Ok to do some walking but nothing excessive.

## 2016-09-05 NOTE — Telephone Encounter (Signed)
Patient had cervical spine surgery, his insurance will not fill script because Oxycodone is written for 1-2 tabs every 6 hours as needed. Has to be 1 every 6 hours as needed.  Per Zonia KiefJames Owens, PA-C, write script for Oxycodone 5/325 1 po q 6 prn post op pain #50.  Patient coming to get script.

## 2016-09-05 NOTE — Progress Notes (Signed)
Patient alert and oriented, mae's well, voiding adequate amount of urine, swallowing without difficulty, no c/o pain at time of discharge. Patient discharged home with family. Script and discharged instructions given to patient. Patient and family stated understanding of instructions given. Patient has an appointment with Dr. Yates  

## 2016-09-06 NOTE — Discharge Summary (Signed)
Patient ID: Bob Mueller MRN: [id number] DOB/AGE: May 07, 1960 56 y.o.  Admit date: 09/04/2016 Discharge date: 09/06/2016  Admission Diagnoses:  Active Problems:   HNP (herniated nucleus pulposus), cervical   Discharge Diagnoses:  Active Problems:   HNP (herniated nucleus pulposus), cervical  status post Procedure(s): C5-6, C6-7 Anterior Cervical Discectomy and Fusion, Allograft, Plate  Past Medical History:  Diagnosis Date  . Arthritis   . Headache    migraines  . Skull fracture (HCC)     Surgeries: Procedure(s): C5-6, C6-7 Anterior Cervical Discectomy and Fusion, Allograft, Plate on 5/7/84698/02/2016   Consultants:   Discharged Condition: Improved  Hospital Course: Bob KatzChristopher A Mackowiak is an 56 y.o. male who was admitted 09/04/2016 for operative treatment of cervical stenosis/HNP. Patient failed conservative treatments (please see the history and physical for the specifics) and had severe unremitting pain that affects sleep, daily activities and work/hobbies. After pre-op clearance, the patient was taken to the operating room on 09/04/2016 and underwent  Procedure(s): C5-6, C6-7 Anterior Cervical Discectomy and Fusion, Allograft, Plate.    Patient was given perioperative antibiotics:  Anti-infectives    Start     Dose/Rate Route Frequency Ordered Stop   09/04/16 2100  ceFAZolin (ANCEF) IVPB 1 g/50 mL premix     1 g 100 mL/hr over 30 Minutes Intravenous Every 8 hours 09/04/16 1650 09/04/16 2221   09/04/16 1036  ceFAZolin (ANCEF) IVPB 2g/100 mL premix     2 g 200 mL/hr over 30 Minutes Intravenous On call to O.R. 09/04/16 1036 09/04/16 1315       Patient was given sequential compression devices and early ambulation to prevent DVT.   Patient benefited maximally from hospital stay and there were no complications. At the time of discharge, the patient was urinating/moving their bowels without difficulty, tolerating a regular diet, pain is controlled with oral pain  medications and they have been cleared by PT/OT.   Recent vital signs: No data found.    Recent laboratory studies: No results for input(s): WBC, HGB, HCT, PLT, NA, K, CL, CO2, BUN, CREATININE, GLUCOSE, INR, CALCIUM in the last 72 hours.  Invalid input(s): PT, 2   Discharge Medications:   Allergies as of 09/05/2016   No Known Allergies     Medication List    STOP taking these medications   cyclobenzaprine 10 MG tablet Commonly known as:  FLEXERIL   GOODYS EXTRA STRENGTH 520-260-32.5 MG Pack Generic drug:  Aspirin-Acetaminophen-Caffeine   hydrocortisone cream 1 %   ibuprofen 200 MG tablet Commonly known as:  ADVIL,MOTRIN   ibuprofen 800 MG tablet Commonly known as:  ADVIL,MOTRIN   multivitamin with minerals Tabs tablet   Turmeric 500 MG Tabs     TAKE these medications   b complex vitamins tablet Take 1 tablet by mouth daily.   fluticasone 50 MCG/ACT nasal spray Commonly known as:  FLONASE Place 1 spray into both nostrils daily.   methocarbamol 500 MG tablet Commonly known as:  ROBAXIN Take 1 tablet (500 mg total) by mouth every 6 (six) hours as needed for muscle spasms.     ASK your doctor about these medications   oxyCODONE-acetaminophen 5-325 MG tablet Commonly known as:  PERCOCET/ROXICET Take 1 tablet by mouth every 6 (six) hours as needed for severe pain.       Diagnostic Studies: Dg Chest 2 View  Result Date: 08/30/2016 CLINICAL DATA:  Preoperative examination (cervical spine surgery). History of CAD. EXAM: CHEST  2 VIEW COMPARISON:  02/21/2015; chest CT -  02/27/2015 FINDINGS: Grossly unchanged cardiac silhouette and mediastinal contours. No focal parenchymal opacities. No pleural effusion or pneumothorax. No evidence of edema. No acute osseus abnormalities. IMPRESSION: No acute cardiopulmonary disease. Electronically Signed   By: Simonne ComeJohn  Watts M.D.   On: 08/30/2016 15:49   Dg Cervical Spine 2-3 Views  Result Date: 09/04/2016 CLINICAL DATA:  C5-6 and  C6-7 ACDF. EXAM: DG C-ARM 61-120 MIN; CERVICAL SPINE - 2-3 VIEW COMPARISON:  None. FINDINGS: AP and lateral intraprocedural fluoroscopic views show C5-6 and C6-7 ACDF. Ventral plate and intervertebral cages are in expected position. IMPRESSION: Fluoroscopy for C5-6 and C6-7 ACDF.  No unexpected finding. Electronically Signed   By: Marnee SpringJonathon  Watts M.D.   On: 09/04/2016 15:26   Dg C-arm 1-60 Min  Result Date: 09/04/2016 CLINICAL DATA:  C5-6 and C6-7 ACDF. EXAM: DG C-ARM 61-120 MIN; CERVICAL SPINE - 2-3 VIEW COMPARISON:  None. FINDINGS: AP and lateral intraprocedural fluoroscopic views show C5-6 and C6-7 ACDF. Ventral plate and intervertebral cages are in expected position. IMPRESSION: Fluoroscopy for C5-6 and C6-7 ACDF.  No unexpected finding. Electronically Signed   By: Marnee SpringJonathon  Watts M.D.   On: 09/04/2016 15:26      Follow-up Information    Schedule an appointment as soon as possible for a visit with Eldred MangesYates, Mark C, MD.   Specialty:  Orthopedic Surgery Why:  need return office visit one week postop Contact information: [address] [place] [address] [phone]           Discharge Plan:  discharge to home  Disposition:     Signed: Zonia KiefJames Cristan Scherzer  09/06/2016, 12:33 PM

## 2016-09-08 NOTE — Anesthesia Postprocedure Evaluation (Signed)
Anesthesia Post Note  Patient: Cristal DeerChristopher A Magner  Procedure(s) Performed: Procedure(s) (LRB): C5-6, C6-7 Anterior Cervical Discectomy and Fusion, Allograft, Plate (N/A)     Patient location during evaluation: PACU Anesthesia Type: General Level of consciousness: awake and alert Pain management: pain level controlled Vital Signs Assessment: post-procedure vital signs reviewed and stable Respiratory status: spontaneous breathing, nonlabored ventilation, respiratory function stable and patient connected to nasal cannula oxygen Cardiovascular status: blood pressure returned to baseline and stable Postop Assessment: no signs of nausea or vomiting Anesthetic complications: no    Last Vitals:  Vitals:   09/05/16 0400 09/05/16 0810  BP: 106/79 111/76  Pulse: 85 69  Resp: 20 20  Temp: 36.8 C 36.8 C    Last Pain:  Vitals:   09/05/16 1316  TempSrc:   PainSc: 6                  Zhaire Locker,JAMES TERRILL

## 2016-09-11 ENCOUNTER — Ambulatory Visit (INDEPENDENT_AMBULATORY_CARE_PROVIDER_SITE_OTHER): Payer: BLUE CROSS/BLUE SHIELD

## 2016-09-11 ENCOUNTER — Ambulatory Visit (INDEPENDENT_AMBULATORY_CARE_PROVIDER_SITE_OTHER): Payer: BLUE CROSS/BLUE SHIELD | Admitting: Orthopaedic Surgery

## 2016-09-11 DIAGNOSIS — Z981 Arthrodesis status: Secondary | ICD-10-CM

## 2016-09-11 DIAGNOSIS — M542 Cervicalgia: Secondary | ICD-10-CM

## 2016-09-11 MED ORDER — ONDANSETRON HCL 4 MG PO TABS: 4.0000 mg | ORAL_TABLET | Freq: Three times a day (TID) | ORAL | 0 refills | Status: DC | PRN

## 2016-09-11 NOTE — Progress Notes (Signed)
   Post-Op Visit Note   Patient: Bob Mueller           Date of Birth: 14-Apr-1960           MRN: [id number] Visit Date: 09/11/2016 PCP: Patient, No Pcp Per   Assessment & Plan: Postop to low cervical fusion. Office follow-up 5 weeks.  Chief Complaint: No chief complaint on file.  Visit Diagnoses:  1. Neck pain   2. S/P cervical spinal fusion     Plan: Steri-Strips are changed. He'll continue with his collar extra colic covers given office follow-up 5 weeks for lateral flexion-extension C-spine x-ray  Follow-Up Instructions: No Follow-up on file.   Orders:  Orders Placed This Encounter  Procedures  . XR Cervical Spine 2 or 3 views   No orders of the defined types were placed in this encounter.   Imaging: Xr Cervical Spine 2 Or 3 Views  Result Date: 09/11/2016 AP and lateral cervical spine x-ray showed good position of the fusion at C5-6 C6-7 with interbody grafts and plate. Impression: Satisfactory postop C5-6 C6-7 ACDF x-rays   PMFS History: Patient Active Problem List   Diagnosis Date Noted  . HNP (herniated nucleus pulposus), cervical 09/04/2016   Past Medical History:  Diagnosis Date  . Arthritis   . Headache    migraines  . Skull fracture (HCC)     No family history on file.  Past Surgical History:  Procedure Laterality Date  . ANTERIOR CERVICAL DECOMP/DISCECTOMY FUSION N/A 09/04/2016   Procedure: C5-6, C6-7 Anterior Cervical Discectomy and Fusion, Allograft, Plate;  Surgeon: Eldred MangesYates, Mark C, MD;  Location: [facility] OR;  Service: Orthopedics;  Laterality: N/A;  . TONSILLECTOMY     Social History   Occupational History  . Not on file.   Social History Main Topics  . Smoking status: Current Some Day Smoker    Types: Cigars  . Smokeless tobacco: Never Used     Comment: smokes cigar "very rarely"  . Alcohol use Yes     Comment: rarely  . Drug use: Yes    Frequency: 3.0 times per week    Types: Marijuana     Comment: 2-3 times/week  . Sexual  activity: Not on file

## 2016-10-22 ENCOUNTER — Encounter (INDEPENDENT_AMBULATORY_CARE_PROVIDER_SITE_OTHER): Payer: Self-pay | Admitting: Orthopaedic Surgery

## 2016-10-22 ENCOUNTER — Ambulatory Visit (INDEPENDENT_AMBULATORY_CARE_PROVIDER_SITE_OTHER): Payer: BLUE CROSS/BLUE SHIELD | Admitting: Orthopaedic Surgery

## 2016-10-22 ENCOUNTER — Ambulatory Visit (INDEPENDENT_AMBULATORY_CARE_PROVIDER_SITE_OTHER): Payer: BLUE CROSS/BLUE SHIELD

## 2016-10-22 VITALS — BP 105/70 | HR 67 | Ht 71.0 in | Wt 145.0 lb

## 2016-10-22 DIAGNOSIS — Z981 Arthrodesis status: Secondary | ICD-10-CM

## 2016-10-22 NOTE — Progress Notes (Signed)
   Post-Op Visit Note   Patient: Bob Mueller           Date of Birth: 1960/02/28           MRN: [id number] Visit Date: 10/22/2016 PCP: Patient, No Pcp Per   Assessment & Plan: Follow-up two-level cervical fusion. He's had a little bit of soreness in his neck some on the left side of his neck and some at the upper cervical midline region. He's got good relief of his hand numbness and tingling with the surgery.  Chief Complaint:  Chief Complaint  Patient presents with  . Neck - Routine Post Op   Visit Diagnoses:  1. S/P cervical spinal fusion     Plan: Works again for regular work resumption on 10/28/2016 without restrictions. He can return as needed.  Follow-Up Instructions: No Follow-up on file.   Orders:  Orders Placed This Encounter  Procedures  . XR Cervical Spine 2 or 3 views   No orders of the defined types were placed in this encounter.   Imaging: Xr Cervical Spine 2 Or 3 Views  Result Date: 10/22/2016 AP lateral cervical spine x-rays obtained and reviewed including lateral flexion-extension x-ray. This shows satisfactory incorporation at C5-6. C6-7 incorporation of the graft is lagging behind. No loosening of the screws. No motion on flexion-extension. Impression :satisfactory postop two-level cervical fusion with better incorporation at the C5-6 level then C6-7 at 6 weeks.   PMFS History: Patient Active Problem List   Diagnosis Date Noted  . HNP (herniated nucleus pulposus), cervical 09/04/2016   Past Medical History:  Diagnosis Date  . Arthritis   . Headache    migraines  . Skull fracture (HCC)     No family history on file.  Past Surgical History:  Procedure Laterality Date  . ANTERIOR CERVICAL DECOMP/DISCECTOMY FUSION N/A 09/04/2016   Procedure: C5-6, C6-7 Anterior Cervical Discectomy and Fusion, Allograft, Plate;  Surgeon: Eldred Manges, MD;  Location: [facility] OR;  Service: Orthopedics;  Laterality: N/A;  . TONSILLECTOMY     Social History    Occupational History  . Not on file.   Social History Main Topics  . Smoking status: Current Some Day Smoker    Types: Cigars  . Smokeless tobacco: Never Used     Comment: smokes cigar "very rarely"  . Alcohol use Yes     Comment: rarely  . Drug use: Yes    Frequency: 3.0 times per week    Types: Marijuana     Comment: 2-3 times/week  . Sexual activity: Not on file

## 2017-09-09 ENCOUNTER — Encounter
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
## 2018-03-18 ENCOUNTER — Emergency Department (HOSPITAL_COMMUNITY): Payer: BLUE CROSS/BLUE SHIELD

## 2018-03-18 ENCOUNTER — Emergency Department (HOSPITAL_COMMUNITY)
Admission: EM | Admit: 2018-03-18 | Discharge: 2018-03-18 | Disposition: A | Discharge status: Home / Self Care | Payer: BLUE CROSS/BLUE SHIELD | Attending: Emergency Medicine | Admitting: Emergency Medicine

## 2018-03-18 ENCOUNTER — Encounter (HOSPITAL_COMMUNITY): Payer: Self-pay | Admitting: *Deleted

## 2018-03-18 DIAGNOSIS — S161XXA Strain of muscle, fascia and tendon at neck level, initial encounter: Secondary | ICD-10-CM | POA: Diagnosis present

## 2018-03-18 DIAGNOSIS — Y939 Activity, unspecified: Secondary | ICD-10-CM | POA: Diagnosis not present

## 2018-03-18 DIAGNOSIS — Y929 Unspecified place or not applicable: Secondary | ICD-10-CM | POA: Insufficient documentation

## 2018-03-18 DIAGNOSIS — Y990 Civilian activity done for income or pay: Secondary | ICD-10-CM | POA: Insufficient documentation

## 2018-03-18 DIAGNOSIS — Z79899 Other long term (current) drug therapy: Secondary | ICD-10-CM | POA: Diagnosis not present

## 2018-03-18 DIAGNOSIS — F17210 Nicotine dependence, cigarettes, uncomplicated: Secondary | ICD-10-CM | POA: Diagnosis not present

## 2018-03-18 MED ORDER — IBUPROFEN 400 MG PO TABS
400.0000 mg | ORAL_TABLET | Freq: Once | ORAL | Status: AC
  Administered 2018-03-18: 400 mg via ORAL
  Filled 2018-03-18: qty 1
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
## 2018-03-18 NOTE — Discharge Instructions (Addendum)
Take over-the-counter medications as needed for pain, symptoms should improve over the next several days, you can also try alternating ice and heat
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
## 2018-03-18 NOTE — ED Notes (Signed)
Patient transported to CT 

## 2019-06-24 ENCOUNTER — Encounter (HOSPITAL_COMMUNITY): Payer: Self-pay

## 2019-06-24 ENCOUNTER — Ambulatory Visit (HOSPITAL_COMMUNITY)
Admission: EM | Admit: 2019-06-24 | Discharge: 2019-06-24 | Disposition: A | Discharge status: Home / Self Care | Payer: BC Managed Care – PPO | Attending: Family Medicine | Admitting: Family Medicine

## 2019-06-24 ENCOUNTER — Other Ambulatory Visit: Payer: Self-pay

## 2019-06-24 DIAGNOSIS — H6120 Impacted cerumen, unspecified ear: Secondary | ICD-10-CM

## 2019-06-24 DIAGNOSIS — H9202 Otalgia, left ear: Secondary | ICD-10-CM

## 2019-06-24 DIAGNOSIS — J320 Chronic maxillary sinusitis: Secondary | ICD-10-CM

## 2019-06-24 MED ORDER — FLUTICASONE PROPIONATE 50 MCG/ACT NA SUSP: 1.0000 | Freq: Every day | NASAL | 0 refills | Status: DC

## 2019-06-24 MED ORDER — AMOXICILLIN 875 MG PO TABS: 875.0000 mg | ORAL_TABLET | Freq: Two times a day (BID) | ORAL | 0 refills | Status: DC

## 2019-06-24 NOTE — ED Provider Notes (Signed)
[facility]    CSN: [id number] Arrival date & time: 06/24/19  6767      History   Chief Complaint Chief Complaint  Patient presents with  . Otalgia    HPI Bob Mueller is a 59 y.o. male.   HPI  Patient states that he has had pain in his sinuses, points to the maxillary region, nasal congestion, postnasal drip and sore throat, and left ear pain intermittently for about 3 months.  Now symptoms are constant.  He states it hurts to lay on his left side or with pressure on his left ear.  The hearing is diminished.  No drainage.  He has had trouble with cerumen buildup in the past.  Past Medical History:  Diagnosis Date  . Arthritis   . Headache    migraines  . Skull fracture [facility])     Patient Active Problem List   Diagnosis Date Noted  . HNP (herniated nucleus pulposus), cervical 09/04/2016    Past Surgical History:  Procedure Laterality Date  . ANTERIOR CERVICAL DECOMP/DISCECTOMY FUSION N/A 09/04/2016   Procedure: C5-6, C6-7 Anterior Cervical Discectomy and Fusion, Allograft, Plate;  Surgeon: Eldred Manges, MD;  Location: [facility] OR;  Service: Orthopedics;  Laterality: N/A;  . TONSILLECTOMY         Home Medications    Prior to Admission medications   Medication Sig Start Date End Date Taking? Authorizing Provider  amoxicillin (AMOXIL) 875 MG tablet Take 1 tablet (875 mg total) by mouth 2 (two) times daily. 06/24/19   Eustace Moore, MD  b complex vitamins tablet Take 1 tablet by mouth daily.    [provider]  fluticasone (FLONASE) 50 MCG/ACT nasal spray Place 1 spray into both nostrils daily. 06/24/19   Eustace Moore, MD  ibuprofen (ADVIL,MOTRIN) 800 MG tablet  07/23/16   [provider]    Family History Family History  Problem Relation Age of Onset  . Diabetes Mother   . Migraines Mother   . Dementia Father   . Stroke Father     Social History Social History   Tobacco Use  . Smoking status: Current Some Day  Smoker    Types: Cigars  . Smokeless tobacco: Never Used  . Tobacco comment: smokes cigar "very rarely"  Substance Use Topics  . Alcohol use: Yes    Comment: rarely  . Drug use: Yes    Frequency: 3.0 times per week    Types: Marijuana    Comment: 2-3 times/week     Allergies   Patient has no known allergies.   Review of Systems Review of Systems  Constitutional: Negative for chills and fever.  HENT: Positive for dental problem, ear pain, hearing loss, postnasal drip, rhinorrhea, sinus pressure and sore throat.   Respiratory: Negative for cough and shortness of breath.   Gastrointestinal: Negative for nausea and vomiting.     Physical Exam Triage Vital Signs ED Triage Vitals  Enc Vitals Group     BP 06/24/19 0841 115/78     Pulse Rate 06/24/19 0841 69     Resp 06/24/19 0841 18     Temp 06/24/19 0841 99.2 F (37.3 C)     Temp Source 06/24/19 0841 Oral     SpO2 06/24/19 0841 97 %     Weight 06/24/19 0844 144 lb 6.4 oz (65.5 kg)     Height --      Head Circumference --      Peak Flow --  Pain Score 06/24/19 0843 5     Pain Loc --      Pain Edu? --      Excl. in [place]? --    No data found.  Updated Vital Signs BP 115/78 (BP Location: Right Arm)   Pulse 69   Temp 99.2 F (37.3 C) (Oral)   Resp 18   Wt 65.5 kg   SpO2 97%   BMI 20.14 kg/m  Physical Exam Constitutional:      General: He is not in acute distress.    Appearance: He is well-developed and normal weight. He is not ill-appearing.  HENT:     Head: Normocephalic and atraumatic.     Right Ear: Tympanic membrane, ear canal and external ear normal.     Ears:     Comments: Left TM occluded by cerumen    Nose: Congestion and rhinorrhea present.     Mouth/Throat:     Mouth: Mucous membranes are moist.     Pharynx: Posterior oropharyngeal erythema present.  Eyes:     Conjunctiva/sclera: Conjunctivae normal.     Pupils: Pupils are equal, round, and reactive to light.  Cardiovascular:     Rate and  Rhythm: Normal rate and regular rhythm.     Heart sounds: Normal heart sounds.  Pulmonary:     Effort: Pulmonary effort is normal. No respiratory distress.     Breath sounds: Normal breath sounds.  Musculoskeletal:        General: Normal range of motion.     Cervical back: Normal range of motion.  Lymphadenopathy:     Cervical: No cervical adenopathy.  Skin:    General: Skin is warm and dry.  Neurological:     Mental Status: He is alert.  Psychiatric:        Mood and Affect: Mood normal.        Behavior: Behavior normal.      UC Treatments / Results  Labs (all labs ordered are listed, but only abnormal results are displayed) Labs Reviewed - No data to display  EKG   Radiology No results found.  Procedures Procedures (including critical care time)  Medications Ordered in UC Medications - No data to display  Initial Impression / Assessment and Plan / UC Course  I have reviewed the triage vital signs and the nursing notes.  Pertinent labs & imaging results that were available during my care of the patient were reviewed by me and considered in my medical decision making (see chart for details).      Final Clinical Impressions(s) / UC Diagnoses   Final diagnoses:  Otalgia of left ear  Cerumen in auditory canal on examination  Chronic maxillary sinusitis     Discharge Instructions     Drink plenty of water Use the flonase daily to help sinuses and ear drain Take the amoxicillin as directed 2 x a day for a week Return as needed   ED Prescriptions    Medication Sig Dispense Auth. Provider   fluticasone (FLONASE) 50 MCG/ACT nasal spray  (Status: Discontinued) Place 1 spray into both nostrils daily. 18.2 mL Raylene Everts, MD   amoxicillin (AMOXIL) 875 MG tablet Take 1 tablet (875 mg total) by mouth 2 (two) times daily. 14 tablet Raylene Everts, MD   fluticasone [facility]) 50 MCG/ACT nasal spray Place 1 spray into both nostrils daily. 16 g Raylene Everts, MD     PDMP not reviewed this encounter.   Raylene Everts, MD 06/24/19  0953  

## 2019-06-24 NOTE — Discharge Instructions (Signed)
Drink plenty of water Use the flonase daily to help sinuses and ear drain Take the amoxicillin as directed 2 x a day for a week Return as needed

## 2019-06-24 NOTE — ED Triage Notes (Signed)
Pt is here with left ear pain that started 3 months ago, pt is having some congestion states it could a sinus infection. Pt has not taken anything to relieve discomfort.

## 2021-03-05 ENCOUNTER — Ambulatory Visit (INDEPENDENT_AMBULATORY_CARE_PROVIDER_SITE_OTHER): Payer: 59

## 2021-03-05 ENCOUNTER — Ambulatory Visit: Payer: 59 | Admitting: Internal Medicine

## 2021-03-05 ENCOUNTER — Encounter: Payer: Self-pay | Admitting: Internal Medicine

## 2021-03-05 ENCOUNTER — Other Ambulatory Visit: Payer: Self-pay

## 2021-03-05 VITALS — BP 116/70 | HR 70 | Temp 98.2°F | Ht 71.0 in | Wt 157.0 lb

## 2021-03-05 DIAGNOSIS — Z23 Encounter for immunization: Secondary | ICD-10-CM

## 2021-03-05 DIAGNOSIS — Z1159 Encounter for screening for other viral diseases: Secondary | ICD-10-CM | POA: Insufficient documentation

## 2021-03-05 DIAGNOSIS — R052 Subacute cough: Secondary | ICD-10-CM

## 2021-03-05 DIAGNOSIS — Z0001 Encounter for general adult medical examination with abnormal findings: Secondary | ICD-10-CM

## 2021-03-05 DIAGNOSIS — Z1211 Encounter for screening for malignant neoplasm of colon: Secondary | ICD-10-CM | POA: Insufficient documentation

## 2021-03-05 LAB — LIPID PANEL
Cholesterol: 215 mg/dL — ABNORMAL HIGH (ref 0–200)
HDL: 56 mg/dL (ref >39.00)
LDL Cholesterol: 126 mg/dL — ABNORMAL HIGH (ref 0–99)
NonHDL: 158.59
Total CHOL/HDL Ratio: 4
Triglycerides: 163 mg/dL — ABNORMAL HIGH (ref 0.0–149.0)
VLDL: 32.6 mg/dL (ref 0.0–40.0)

## 2021-03-05 LAB — PSA: PSA: 1.41 ng/mL (ref 0.10–4.00)

## 2021-03-05 NOTE — Progress Notes (Signed)
Subjective:  Patient ID: Bob Mueller, male    DOB: 12-Jan-1961  Age: 62 y.o. MRN: [id number]  CC: Cough and Annual Exam  This visit occurred during the SARS-CoV-2 public health emergency.  Safety protocols were in place, including screening questions prior to the visit, additional usage of staff PPE, and extensive cleaning of exam room while observing appropriate contact time as indicated for disinfecting solutions.    HPI Bob Mueller presents for a CPX and to establish.  He has a remote history of lung nodule.  He quit smoking cigarettes about 25 years ago.  He smokes a few cigars a week.  He has a several week history of cough productive of yellowish phlegm.  He denies chest pain, hemoptysis, fever, chills, night sweats, wheezing, or shortness of breath.  History Bob Mueller has a past medical history of Arthritis, Headache, and Skull fracture ([place]).   He has a past surgical history that includes Tonsillectomy and Anterior cervical decomp/discectomy fusion (N/A, 09/04/2016).   His family history includes Dementia in his father; Diabetes in his mother and sister; Migraines in his mother; Stroke in his father.He reports that he has been smoking cigars. He has never used smokeless tobacco. He reports that he does not currently use alcohol. He reports current drug use. Frequency: 3.00 times per week. Drug: Marijuana.  Outpatient Medications Prior to Visit  Medication Sig Dispense Refill   amoxicillin (AMOXIL) 875 MG tablet Take 1 tablet (875 mg total) by mouth 2 (two) times daily. (Patient not taking: Reported on 03/09/2021) 14 tablet 0   b complex vitamins tablet Take 1 tablet by mouth daily. (Patient not taking: Reported on 03/09/2021)     fluticasone (FLONASE) 50 MCG/ACT nasal spray Place 1 spray into both nostrils daily. 16 g 0   ibuprofen (ADVIL,MOTRIN) 800 MG tablet      No facility-administered medications prior to visit.    ROS Review of Systems   Constitutional:  Negative for appetite change, chills, diaphoresis, fatigue and fever.  HENT: Negative.  Negative for sinus pressure.   Eyes: Negative.   Respiratory:  Positive for cough. Negative for chest tightness, shortness of breath and wheezing.   Cardiovascular:  Negative for chest pain, palpitations and leg swelling.  Gastrointestinal:  Negative for abdominal pain, constipation, diarrhea, nausea and vomiting.  Genitourinary: Negative.  Negative for difficulty urinating, dysuria and hematuria.  Musculoskeletal: Negative.  Negative for arthralgias and myalgias.  Skin: Negative.   Neurological:  Negative for dizziness, weakness, light-headedness, numbness and headaches.  Hematological:  Negative for adenopathy. Does not bruise/bleed easily.  Psychiatric/Behavioral: Negative.     Objective:  BP 116/70 (BP Location: Right Arm, Patient Position: Sitting, Cuff Size: Large)    Pulse 70    Temp 98.2 F (36.8 C) (Oral)    Ht 5\' 11"  (1.803 m)    Wt 157 lb (71.2 kg)    SpO2 97%    BMI 21.90 kg/m   Physical Exam Vitals reviewed.  Constitutional:      Appearance: Normal appearance.  HENT:     Nose: Nose normal.     Mouth/Throat:     Mouth: Mucous membranes are moist.  Eyes:     General: No scleral icterus.    Conjunctiva/sclera: Conjunctivae normal.  Cardiovascular:     Rate and Rhythm: Normal rate and regular rhythm.     Heart sounds: No murmur heard.   No gallop.  Pulmonary:     Effort: Pulmonary effort is normal.  Breath sounds: No stridor. No wheezing, rhonchi or rales.  Abdominal:     General: Abdomen is flat.     Palpations: There is no mass.     Tenderness: There is no abdominal tenderness. There is no guarding or rebound.     Hernia: No hernia is present. There is no hernia in the left inguinal area or right inguinal area.  Genitourinary:    Pubic Area: No rash.      Penis: Normal and circumcised.      Testes: Normal.        Right: Mass, tenderness or swelling not  present.        Left: Mass, tenderness or swelling not present.     Epididymis:     Right: Normal.     Left: Normal.     Prostate: Normal. Not enlarged, not tender and no nodules present.     Rectum: Normal. Guaiac result negative. No mass, tenderness, anal fissure, external hemorrhoid or internal hemorrhoid. Normal anal tone.  Musculoskeletal:        General: Normal range of motion.     Cervical back: Neck supple.     Right lower leg: No edema.     Left lower leg: No edema.  Lymphadenopathy:     Cervical: No cervical adenopathy.     Lower Body: No right inguinal adenopathy. No left inguinal adenopathy.  Skin:    General: Skin is warm and dry.     Coloration: Skin is not pale.  Neurological:     General: No focal deficit present.     Mental Status: He is alert.  Psychiatric:        Mood and Affect: Mood normal.        Behavior: Behavior normal.    Lab Results  Component Value Date   WBC 8.0 08/30/2016   HGB 13.8 08/30/2016   HCT 40.7 08/30/2016   PLT 217 08/30/2016   GLUCOSE 96 08/30/2016   CHOL 215 (H) 03/05/2021   TRIG 163.0 (H) 03/05/2021   HDL 56.00 03/05/2021   LDLCALC 126 (H) 03/05/2021   ALT 18 08/30/2016   AST 23 08/30/2016   NA 138 08/30/2016   K 4.6 08/30/2016   CL 106 08/30/2016   CREATININE 0.76 08/30/2016   BUN 15 08/30/2016   CO2 26 08/30/2016   PSA 1.41 03/05/2021    DG Chest 2 View  Result Date: 03/05/2021 CLINICAL DATA:  Cough EXAM: CHEST - 2 VIEW COMPARISON:  Chest x-ray dated August 30, 2016 FINDINGS: The heart size and mediastinal contours are within normal limits. Both lungs are clear. The visualized skeletal structures are unremarkable. IMPRESSION: No active cardiopulmonary disease. Electronically Signed   By: Yetta Glassman M.D.   On: 03/05/2021 14:26     Assessment & Plan:   Bob Mueller was seen today for cough and annual exam.  Diagnoses and all orders for this visit:  Encounter for general adult medical examination with abnormal  findings- Exam completed, labs reviewed, vaccines reviewed and updated, cancer screenings addressed, patient education material was given. -     Lipid panel; Future -     PSA; Future -     HIV Antibody (routine testing w rflx); Future -     Hepatitis C antibody; Future -     Hepatitis C antibody -     HIV Antibody (routine testing w rflx) -     PSA -     Lipid panel  Subacute cough- Shest x-ray is negative for  mass or infiltrate.  This is likely a viral URI.  If symptoms persist then will consider prescribing an antibiotic. -     DG Chest 2 View; Future  Colon cancer screening -     Cologuard  Need for hepatitis C screening test -     Hepatitis C antibody; Future -     Hepatitis C antibody  Other orders -     Flu Vaccine QUAD 6+ mos PF IM (Fluarix Quad PF)   I am having Bob Mueller maintain his b complex vitamins, ibuprofen, amoxicillin, and fluticasone.  No orders of the defined types were placed in this encounter.    Follow-up: Return in about 3 months (around 06/03/2021).  Scarlette Calico, MD

## 2021-03-05 NOTE — Patient Instructions (Signed)

## 2021-03-06 LAB — HEPATITIS C ANTIBODY
Hepatitis C Ab: NONREACTIVE
SIGNAL TO CUT-OFF: <0.02 (ref <1.00)

## 2021-03-06 LAB — HIV ANTIBODY (ROUTINE TESTING W REFLEX): HIV 1&2 Ab, 4th Generation: NONREACTIVE

## 2021-03-09 ENCOUNTER — Other Ambulatory Visit: Payer: Self-pay

## 2021-03-09 ENCOUNTER — Ambulatory Visit: Payer: 59 | Admitting: Internal Medicine

## 2021-03-09 VITALS — BP 110/72 | HR 77 | Temp 97.9°F | Ht 70.5 in | Wt 158.4 lb

## 2021-03-09 DIAGNOSIS — L720 Epidermal cyst: Secondary | ICD-10-CM | POA: Diagnosis not present

## 2021-03-09 NOTE — Progress Notes (Signed)
Subjective:  Patient ID: Bob Mueller, male    DOB: [birth year]  Age: 61 y.o. MRN: [id number]  CC: Follow-up  This visit occurred during the SARS-CoV-2 public health emergency.  Safety protocols were in place, including screening questions prior to the visit, additional usage of staff PPE, and extensive cleaning of exam room while observing appropriate contact time as indicated for disinfecting solutions.    HPI Bob Mueller Maybee presents for f/up -  He complains of a cyst on the back of his neck that has been enlarging for one year.  Outpatient Medications Prior to Visit  Medication Sig Dispense Refill   Ascorbic Acid (VITAMIN C ADULT GUMMIES PO) Take by mouth once.     fluticasone (FLONASE) 50 MCG/ACT nasal spray Place 1 spray into both nostrils daily. 16 g 0   ibuprofen (ADVIL,MOTRIN) 800 MG tablet      amoxicillin (AMOXIL) 875 MG tablet Take 1 tablet (875 mg total) by mouth 2 (two) times daily. (Patient not taking: Reported on 03/09/2021) 14 tablet 0   b complex vitamins tablet Take 1 tablet by mouth daily. (Patient not taking: Reported on 03/09/2021)     No facility-administered medications prior to visit.    ROS Review of Systems  All other systems reviewed and are negative.  Objective:  BP 110/72 (BP Location: Right Arm, Patient Position: Sitting, Cuff Size: Large)    Pulse 77    Temp 97.9 F (36.6 C) (Oral)    Ht 5' 10.5" (1.791 m)    Wt 158 lb 6 oz (71.8 kg)    SpO2 99%    BMI 22.40 kg/m   BP Readings from Last 3 Encounters:  03/09/21 110/72  03/05/21 116/70  06/24/19 115/78    Wt Readings from Last 3 Encounters:  03/09/21 158 lb 6 oz (71.8 kg)  03/05/21 157 lb (71.2 kg)  06/24/19 144 lb 6.4 oz (65.5 kg)    Physical Exam Neck:     Lab Results  Component Value Date   WBC 8.0 08/30/2016   HGB 13.8 08/30/2016   HCT 40.7 08/30/2016   PLT 217 08/30/2016   GLUCOSE 96 08/30/2016   CHOL 215 (H) 03/05/2021   TRIG 163.0 (H) 03/05/2021   HDL  56.00 03/05/2021   LDLCALC 126 (H) 03/05/2021   ALT 18 08/30/2016   AST 23 08/30/2016   NA 138 08/30/2016   K 4.6 08/30/2016   CL 106 08/30/2016   CREATININE 0.76 08/30/2016   BUN 15 08/30/2016   CO2 26 08/30/2016   PSA 1.41 03/05/2021    After informed verbal consent was obtained. Using Betadine for cleansing and 2% Lidocaine with epinephrine for anesthetic (2 cc's used), with sterile technique a 4 mm punch incision was made and a small cavity was found with cheesy cystic material but no exudate. The cavity was irrigated with H2O2 and Qtips. No deep tracking or loculations were found. The cavity was packed with iodoform. Hemostasis was obtained by pressure. The procedure was well tolerated without complications. A dressing was applied.  Assessment & Plan:   Bob Mueller was seen today for follow-up.  Diagnoses and all orders for this visit:  EIC (epidermal inclusion cyst)- Successful I and D, he will remove the packing in 1-2 days, he will let me know of any symptoms.   I am having Alfred A. Litle maintain his b complex vitamins, ibuprofen, amoxicillin, fluticasone, and Ascorbic Acid (VITAMIN C ADULT GUMMIES PO).  No orders of the defined types were placed in  this encounter.    Follow-up: No follow-ups on file.  Sanda Linger, MD

## 2021-03-10 ENCOUNTER — Encounter: Payer: Self-pay | Admitting: Internal Medicine

## 2021-03-10 NOTE — Patient Instructions (Signed)
Incision and Drainage Incision and drainage is a surgical procedure to open and drain a fluid-filled sac. The sac may be filled with pus, mucus, or blood. Examples of fluid-filled sacs that may need surgical drainage include cysts, skin infections (abscesses), and red lumps that develop from a ruptured cyst or a small abscess (boils). You may need this procedure if the affected area is large, painful, infected, or not healing well. Tell a health care provider about: Any allergies you have. All medicines you are taking, including vitamins, herbs, eye drops, creams, and over-the-counter medicines. Any problems you or family members have had with anesthetic medicines. Any blood disorders you have or have had. Any surgeries you have had. Any medical conditions you have or have had. Whether you are pregnant or may be pregnant. What are the risks? Generally, this is a safe procedure. However, problems may occur, including: Infection. Bleeding. Allergic reactions to medicines. Scarring. The cyst or abscess returns. Damage to nerves or vessels. What happens before the procedure? Medicine Ask your health care provider about: Changing or stopping your regular medicines. This is especially important if you are taking diabetes medicines or blood thinners. Taking medicines such as aspirin and ibuprofen. These medicines can thin your blood. Do not take these medicines unless your health care provider tells you to take them. Taking over-the-counter medicines, vitamins, herbs, and supplements. Tests You may have an exam or testing. These may include: Ultrasound or other imaging tests to see how large or deep the fluid-filled sac is. Blood tests to check for infection. General instructions Follow instructions from your health care provider about eating or drinking restrictions. Plan to have someone take you home from the hospital or clinic. Ask your health care provider whether a responsible adult  should care for you for at least 24 hours after you leave the hospital or clinic. This is important. You may get a tetanus shot. Ask your health care provider: How your surgery site will be marked or identified. What steps will be taken to help prevent infection. These may include: Removing hair at the surgery site. Washing skin with a germ-killing soap. Receiving antibiotic medicine. What happens during the procedure?  An IV may be inserted into one of your veins. You will be given one or more of the following: A medicine to help you relax (sedative). A medicine to numb the area (local anesthetic). A medicine to make you fall asleep (general anesthetic). An incision will be made in the top of the fluid-filled sac. Pus, blood, and mucus will be squeezed out, and a syringe or tube (drain) may be used to empty more fluid from the sac. Your health care provider will do one of the following. He or she may: Leave the drain in place for several weeks to drain more fluid. Stitch open the edges of the incision to make a long-term opening for drainage (marsupialization). The inside of the sac may be washed out (irrigated) with a sterile solution and packed with gauze before it is covered with a bandage (dressing). Your health care provider do a culture test of the drainage fluid. The procedure may vary among health care providers and hospitals. What happens after the procedure? Your blood pressure, heart rate, breathing rate, and blood oxygen level will be monitored often until you leave the hospital or clinic. Do not drive for 24 hours if you were given a sedative during your procedure. Summary Incision and drainage is a surgical procedure to open and drain a fluid-filled sac.   The sac may be filled with pus, mucus, or blood. Before the procedure, you may be given antibiotic medicine to treat or help prevent infection. During the procedure, an incision will be made in the top of the fluid-filled  sac. Pus, blood, and mucus is squeezed out, and a syringe or tube (drain) may be used to empty more fluid from the sac. The inside of the sac may be washed out (irrigated) with a sterile solution and packed with gauze before it is covered with a bandage (dressing). This information is not intended to replace advice given to you by your health care provider. Make sure you discuss any questions you have with your health care provider. Document Revised: 12/22/2017 Document Reviewed: 12/22/2017 Elsevier Patient Education  2022 [employer] Inc.  

## 2021-03-11 ENCOUNTER — Encounter: Payer: Self-pay | Admitting: Internal Medicine

## 2021-04-09 LAB — COLOGUARD: COLOGUARD: NEGATIVE

## 2021-10-12 ENCOUNTER — Emergency Department (HOSPITAL_COMMUNITY): Payer: 59

## 2021-10-12 ENCOUNTER — Emergency Department (HOSPITAL_COMMUNITY)
Admission: EM | Admit: 2021-10-12 | Discharge: 2021-10-12 | Disposition: A | Discharge status: Home / Self Care | Payer: 59 | Attending: Emergency Medicine | Admitting: Emergency Medicine

## 2021-10-12 ENCOUNTER — Encounter (HOSPITAL_COMMUNITY): Payer: Self-pay

## 2021-10-12 DIAGNOSIS — Y92410 Unspecified street and highway as the place of occurrence of the external cause: Secondary | ICD-10-CM | POA: Diagnosis not present

## 2021-10-12 DIAGNOSIS — M542 Cervicalgia: Secondary | ICD-10-CM | POA: Diagnosis present

## 2021-10-12 MED ORDER — IBUPROFEN 200 MG PO TABS
600.0000 mg | ORAL_TABLET | Freq: Once | ORAL | Status: AC
  Administered 2021-10-12: 600 mg via ORAL
  Filled 2021-10-12: qty 3

## 2021-10-12 MED ORDER — KETOROLAC TROMETHAMINE 30 MG/ML IJ SOLN
30.0000 mg | Freq: Once | INTRAMUSCULAR | Status: DC
  Filled 2021-10-12: qty 1

## 2021-10-12 NOTE — Discharge Instructions (Signed)
Your CT scan did not reveal any fracture today. You should continue alternating tylenol and ibuprofen at home for your symptoms. Please schedule follow up appointment with Dr. Ophelia Charter.
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
## 2021-11-06 ENCOUNTER — Ambulatory Visit: Payer: 59 | Admitting: Orthopaedic Surgery

## 2021-11-06 ENCOUNTER — Encounter: Payer: Self-pay | Admitting: Orthopaedic Surgery

## 2021-11-06 ENCOUNTER — Ambulatory Visit (INDEPENDENT_AMBULATORY_CARE_PROVIDER_SITE_OTHER): Payer: 59

## 2021-11-06 VITALS — BP 112/71 | HR 66 | Ht 70.0 in | Wt 155.0 lb

## 2021-11-06 DIAGNOSIS — M542 Cervicalgia: Secondary | ICD-10-CM | POA: Diagnosis not present
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
## 2021-11-06 NOTE — Addendum Note (Signed)
Addended by: Meyer Cory on: 11/06/2021 03:15 PM   Modules accepted: Orders

## 2021-11-09 ENCOUNTER — Emergency Department (HOSPITAL_COMMUNITY): Payer: 59

## 2021-11-09 ENCOUNTER — Emergency Department (HOSPITAL_COMMUNITY)
Admission: EM | Admit: 2021-11-09 | Discharge: 2021-11-09 | Disposition: A | Discharge status: Home / Self Care | Payer: 59 | Attending: Emergency Medicine | Admitting: Emergency Medicine

## 2021-11-09 ENCOUNTER — Other Ambulatory Visit: Payer: Self-pay

## 2021-11-09 ENCOUNTER — Encounter (HOSPITAL_COMMUNITY): Payer: Self-pay

## 2021-11-09 DIAGNOSIS — M25512 Pain in left shoulder: Secondary | ICD-10-CM | POA: Insufficient documentation

## 2021-11-09 DIAGNOSIS — R079 Chest pain, unspecified: Secondary | ICD-10-CM | POA: Insufficient documentation

## 2021-11-09 DIAGNOSIS — R0781 Pleurodynia: Secondary | ICD-10-CM | POA: Insufficient documentation

## 2021-11-09 DIAGNOSIS — M542 Cervicalgia: Secondary | ICD-10-CM | POA: Insufficient documentation

## 2021-11-09 DIAGNOSIS — Y92410 Unspecified street and highway as the place of occurrence of the external cause: Secondary | ICD-10-CM | POA: Diagnosis not present

## 2021-11-09 MED ORDER — METHOCARBAMOL 500 MG PO TABS: 500.0000 mg | ORAL_TABLET | Freq: Two times a day (BID) | ORAL | 0 refills | Status: AC

## 2021-11-09 MED ORDER — LIDOCAINE 5 % EX PTCH: 1.0000 | MEDICATED_PATCH | CUTANEOUS | 0 refills | Status: AC
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
## 2021-11-09 NOTE — Discharge Instructions (Signed)
Seen in the emergency department for left shoulder pain.  Your x-rays today did not reveal any fractures.  I have added Robaxin and Lidoderm patches to your pain regimen.  You should follow-up with your orthopedic provider.  You were given a prescription for Robaxin which is a muscle relaxer.  You should not drive, work, consume alcohol, or operate machinery while taking this medication as it can make you very drowsy.

## 2021-11-14 ENCOUNTER — Telehealth: Payer: Self-pay

## 2021-11-14 NOTE — Telephone Encounter (Signed)
Transition Care Management Unsuccessful Follow-up Telephone Call  Date of discharge and from where:   11/09/21 - [facility]  Attempts:  1st Attempt  Reason for unsuccessful TCM follow-up call:  Left voice message

## 2021-11-15 NOTE — Telephone Encounter (Signed)
Transition Care Management Unsuccessful Follow-up Telephone Call  Date of discharge and from where:  11/09/21 - [facility]  Attempts:  3rd Attempt  Reason for unsuccessful TCM follow-up call:  Left voice message
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
## 2021-11-29 ENCOUNTER — Encounter: Payer: Self-pay | Admitting: Rehabilitative and Restorative Service Providers"

## 2021-11-29 ENCOUNTER — Ambulatory Visit: Payer: 59 | Admitting: Rehabilitative and Restorative Service Providers"

## 2021-11-29 ENCOUNTER — Other Ambulatory Visit: Payer: Self-pay

## 2021-11-29 DIAGNOSIS — R293 Abnormal posture: Secondary | ICD-10-CM | POA: Diagnosis not present

## 2021-11-29 DIAGNOSIS — M542 Cervicalgia: Secondary | ICD-10-CM | POA: Diagnosis not present

## 2021-11-29 DIAGNOSIS — M6281 Muscle weakness (generalized): Secondary | ICD-10-CM | POA: Diagnosis not present

## 2021-12-03 ENCOUNTER — Encounter: Payer: Self-pay | Admitting: Rehabilitative and Restorative Service Providers"

## 2021-12-03 ENCOUNTER — Ambulatory Visit: Payer: 59 | Admitting: Rehabilitative and Restorative Service Providers"

## 2021-12-03 DIAGNOSIS — R293 Abnormal posture: Secondary | ICD-10-CM

## 2021-12-03 DIAGNOSIS — M6281 Muscle weakness (generalized): Secondary | ICD-10-CM | POA: Diagnosis not present

## 2021-12-03 DIAGNOSIS — M542 Cervicalgia: Secondary | ICD-10-CM
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
## 2021-12-07 ENCOUNTER — Encounter: Payer: Self-pay | Admitting: Rehabilitative and Restorative Service Providers"

## 2021-12-07 ENCOUNTER — Ambulatory Visit: Payer: 59 | Admitting: Rehabilitative and Restorative Service Providers"

## 2021-12-07 DIAGNOSIS — M6281 Muscle weakness (generalized): Secondary | ICD-10-CM

## 2021-12-07 DIAGNOSIS — M542 Cervicalgia: Secondary | ICD-10-CM

## 2021-12-07 DIAGNOSIS — R293 Abnormal posture: Secondary | ICD-10-CM
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
## 2021-12-10 ENCOUNTER — Ambulatory Visit: Payer: 59 | Admitting: Rehabilitative and Restorative Service Providers"

## 2021-12-10 ENCOUNTER — Encounter: Payer: Self-pay | Admitting: Rehabilitative and Restorative Service Providers"

## 2021-12-10 DIAGNOSIS — R293 Abnormal posture: Secondary | ICD-10-CM | POA: Diagnosis not present

## 2021-12-10 DIAGNOSIS — M6281 Muscle weakness (generalized): Secondary | ICD-10-CM | POA: Diagnosis not present

## 2021-12-10 DIAGNOSIS — M542 Cervicalgia: Secondary | ICD-10-CM
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
## 2021-12-14 ENCOUNTER — Ambulatory Visit: Payer: 59 | Admitting: Rehabilitative and Restorative Service Providers"

## 2021-12-14 ENCOUNTER — Encounter: Payer: Self-pay | Admitting: Rehabilitative and Restorative Service Providers"

## 2021-12-14 DIAGNOSIS — R293 Abnormal posture: Secondary | ICD-10-CM | POA: Diagnosis not present

## 2021-12-14 DIAGNOSIS — M542 Cervicalgia: Secondary | ICD-10-CM

## 2021-12-14 DIAGNOSIS — M6281 Muscle weakness (generalized): Secondary | ICD-10-CM
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
## 2021-12-17 ENCOUNTER — Ambulatory Visit: Payer: 59 | Admitting: Rehabilitative and Restorative Service Providers"

## 2021-12-17 ENCOUNTER — Encounter: Payer: Self-pay | Admitting: Rehabilitative and Restorative Service Providers"

## 2021-12-17 DIAGNOSIS — M542 Cervicalgia: Secondary | ICD-10-CM

## 2021-12-17 DIAGNOSIS — M6281 Muscle weakness (generalized): Secondary | ICD-10-CM

## 2021-12-17 DIAGNOSIS — R293 Abnormal posture: Secondary | ICD-10-CM | POA: Diagnosis not present
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
## 2021-12-21 ENCOUNTER — Encounter: Payer: Self-pay | Admitting: Rehabilitative and Restorative Service Providers"

## 2021-12-21 ENCOUNTER — Ambulatory Visit (INDEPENDENT_AMBULATORY_CARE_PROVIDER_SITE_OTHER): Payer: 59 | Admitting: Rehabilitative and Restorative Service Providers"

## 2021-12-21 DIAGNOSIS — M6281 Muscle weakness (generalized): Secondary | ICD-10-CM

## 2021-12-21 DIAGNOSIS — R293 Abnormal posture: Secondary | ICD-10-CM

## 2021-12-21 DIAGNOSIS — M542 Cervicalgia: Secondary | ICD-10-CM | POA: Diagnosis not present
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
## 2021-12-24 ENCOUNTER — Ambulatory Visit: Payer: 59 | Admitting: Rehabilitative and Restorative Service Providers"

## 2021-12-24 ENCOUNTER — Encounter: Payer: Self-pay | Admitting: Rehabilitative and Restorative Service Providers"

## 2021-12-24 DIAGNOSIS — R293 Abnormal posture: Secondary | ICD-10-CM

## 2021-12-24 DIAGNOSIS — M542 Cervicalgia: Secondary | ICD-10-CM | POA: Diagnosis not present

## 2021-12-24 DIAGNOSIS — M6281 Muscle weakness (generalized): Secondary | ICD-10-CM | POA: Diagnosis not present
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
## 2021-12-31 ENCOUNTER — Encounter: Payer: Self-pay | Admitting: Rehabilitative and Restorative Service Providers"

## 2021-12-31 ENCOUNTER — Ambulatory Visit: Payer: 59 | Admitting: Rehabilitative and Restorative Service Providers"

## 2021-12-31 DIAGNOSIS — R293 Abnormal posture: Secondary | ICD-10-CM

## 2021-12-31 DIAGNOSIS — M6281 Muscle weakness (generalized): Secondary | ICD-10-CM

## 2021-12-31 DIAGNOSIS — M542 Cervicalgia: Secondary | ICD-10-CM | POA: Diagnosis not present
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
## 2022-01-04 ENCOUNTER — Ambulatory Visit: Payer: 59 | Admitting: Rehabilitative and Restorative Service Providers"

## 2022-01-04 ENCOUNTER — Encounter: Payer: Self-pay | Admitting: Rehabilitative and Restorative Service Providers"

## 2022-01-04 DIAGNOSIS — M542 Cervicalgia: Secondary | ICD-10-CM

## 2022-01-04 DIAGNOSIS — M6281 Muscle weakness (generalized): Secondary | ICD-10-CM

## 2022-01-04 DIAGNOSIS — R293 Abnormal posture: Secondary | ICD-10-CM
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
## 2022-01-07 ENCOUNTER — Encounter: Payer: Self-pay | Admitting: Rehabilitative and Restorative Service Providers"

## 2022-01-07 ENCOUNTER — Ambulatory Visit: Payer: 59 | Admitting: Rehabilitative and Restorative Service Providers"

## 2022-01-07 DIAGNOSIS — R293 Abnormal posture: Secondary | ICD-10-CM

## 2022-01-07 DIAGNOSIS — M6281 Muscle weakness (generalized): Secondary | ICD-10-CM

## 2022-01-07 DIAGNOSIS — M542 Cervicalgia: Secondary | ICD-10-CM
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
## 2022-01-21 ENCOUNTER — Encounter: Payer: Self-pay | Admitting: Rehabilitative and Restorative Service Providers"

## 2022-01-21 ENCOUNTER — Ambulatory Visit: Payer: 59 | Admitting: Rehabilitative and Restorative Service Providers"

## 2022-01-21 DIAGNOSIS — M6281 Muscle weakness (generalized): Secondary | ICD-10-CM

## 2022-01-21 DIAGNOSIS — R293 Abnormal posture: Secondary | ICD-10-CM

## 2022-01-21 DIAGNOSIS — M542 Cervicalgia: Secondary | ICD-10-CM | POA: Diagnosis not present
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
## 2023-03-18 ENCOUNTER — Ambulatory Visit: Payer: 59 | Admitting: Dermatology

## 2023-03-18 ENCOUNTER — Encounter: Payer: Self-pay | Admitting: Dermatology

## 2023-03-18 DIAGNOSIS — D492 Neoplasm of unspecified behavior of bone, soft tissue, and skin: Secondary | ICD-10-CM | POA: Diagnosis not present

## 2023-03-18 DIAGNOSIS — B078 Other viral warts: Secondary | ICD-10-CM

## 2023-03-18 DIAGNOSIS — B079 Viral wart, unspecified: Secondary | ICD-10-CM

## 2023-03-18 DIAGNOSIS — D225 Melanocytic nevi of trunk: Secondary | ICD-10-CM | POA: Diagnosis not present

## 2023-03-18 DIAGNOSIS — L821 Other seborrheic keratosis: Secondary | ICD-10-CM | POA: Diagnosis not present

## 2023-03-18 DIAGNOSIS — D489 Neoplasm of uncertain behavior, unspecified: Secondary | ICD-10-CM

## 2023-03-18 DIAGNOSIS — L820 Inflamed seborrheic keratosis: Secondary | ICD-10-CM | POA: Diagnosis not present

## 2023-03-18 NOTE — Patient Instructions (Addendum)
 Cryotherapy Aftercare  Wash gently with soap and water everyday.   Apply Vaseline and Band-Aid daily until healed.   Patient Handout: Wound Care for Skin Biopsy Site  Taking Care of Your Skin Biopsy Site  Proper care of the biopsy site is essential for promoting healing and minimizing scarring. This handout provides instructions on how to care for your biopsy site to ensure optimal recovery.  1. Cleaning the Wound:  Clean the biopsy site daily with gentle soap and water. Gently pat the area dry with a clean, soft towel. Avoid harsh scrubbing or rubbing the area, as this can irritate the skin and delay healing.  2. Applying Aquaphor and Bandage:  After cleaning the wound, apply a thin layer of Aquaphor ointment to the biopsy site. Cover the area with a sterile bandage to protect it from dirt, bacteria, and friction. Change the bandage daily or as needed if it becomes soiled or wet.  3. Continued Care for One Week:  Repeat the cleaning, Aquaphor application, and bandaging process daily for one week following the biopsy procedure. Keeping the wound clean and moist during this initial healing period will help prevent infection and promote optimal healing.  4. Massaging Aquaphor into the Area:  ---After one week, discontinue the use of bandages but continue to apply Aquaphor to the biopsy site. ----Gently massage the Aquaphor into the area using circular motions. ---Massaging the skin helps to promote circulation and prevent the formation of scar tissue.   Additional Tips:  Avoid exposing the biopsy site to direct sunlight during the healing process, as this can cause hyperpigmentation or worsen scarring. If you experience any signs of infection, such as increased redness, swelling, warmth, or drainage from the wound, contact your healthcare provider immediately. Follow any additional instructions provided by your healthcare provider for caring for the biopsy site and managing any  discomfort. Conclusion:  Taking proper care of your skin biopsy site is crucial for ensuring optimal healing and minimizing scarring. By following these instructions for cleaning, applying Aquaphor, and massaging the area, you can promote a smooth and successful recovery. If you have any questions or concerns about caring for your biopsy site, don't hesitate to contact your healthcare provider for guidance.    Skin Education :   I counseled the patient regarding the following: Sun screen (SPF 30 or greater) should be applied during peak UV exposure (between 10am and 2pm) and reapplied after exercise or swimming.  The ABCDEs of melanoma were reviewed with the patient, and the importance of monthly self-examination of moles was emphasized. Should any moles change in shape or color, or itch, bleed or burn, pt will contact our office for evaluation sooner then their interval appointment.  Plan: Sunscreen Recommendations I recommended a broad spectrum sunscreen with a SPF of 30 or higher. I explained that SPF 30 sunscreens block approximately 97 percent of the sun's harmful rays. Sunscreens should be applied at least 15 minutes prior to expected sun exposure and then every 2 hours after that as long as sun exposure continues. If swimming or exercising sunscreen should be reapplied every 45 minutes to an hour after getting wet or sweating. One ounce, or the equivalent of a shot glass full of sunscreen, is adequate to protect the skin not covered by a bathing suit. I also recommended a lip balm with a sunscreen as well. Sun protective clothing can be used in lieu of sunscreen but must be worn the entire time you are exposed to the sun's rays.  Seborrheic Keratosis A seborrheic keratosis is a common, noncancerous (benign) skin growth. These growths are velvety, waxy, or rough spots that appear on the skin. They are often tan, brown, or black. The skin growths can be flat or raised and may be scaly. What  are the causes? The cause of this condition is not known. What increases the risk? You are more likely to develop this condition if you: Have a family history of seborrheic keratosis. Are 90 years old or older. Are pregnant. Have had estrogen replacement therapy. What are the signs or symptoms? Symptoms of this condition include growths on the face, chest, shoulders, back, or other areas. These growths: Are usually painless, but may become irritated and itchy. Can be tan, yellow, brown, black, or other colors. Are slightly raised or have a flat surface. Are sometimes rough or wart-like in texture. Are often velvety or waxy on the surface. Are round or oval-shaped. Often occur in groups, but may occur as a single growth. How is this diagnosed? This condition is diagnosed with a medical history and physical exam. A sample of the growth may be tested (skin biopsy). You may also need to see a skin specialist (dermatologist). How is this treated? Treatment is not usually needed for this condition unless the growths are irritated or bleed often. You may also choose to have the growths removed if you do not like their appearance. Growth removal may include a procedure in which: Liquid nitrogen is applied to "freeze" off the growth (cryosurgery). This is the most common procedure. The growth is burned off with electricity (electrocautery). The growth is removed by scraping (curettage). Follow these instructions at home: Watch your growth or growths for any changes. Do not scratch or pick at the growth or growths. This can cause them to become irritated or infected. Contact a health care provider if: You suddenly have many new growths. Your growth bleeds, itches, or hurts. Your growth suddenly becomes larger or changes color. Summary A seborrheic keratosis is a common, noncancerous skin growth. Treatment is not usually needed for this condition unless the growths are irritated or bleed  often. Watch your growth or growths for any changes. Contact a health care provider if you suddenly have many new growths or your growth suddenly becomes larger or changes color. This information is not intended to replace advice given to you by your health care provider. Make sure you discuss any questions you have with your health care provider. Document Revised: 04/06/2021 Document Reviewed: 04/06/2021 Elsevier Patient Education  2024 [employer].   Important Information  Due to recent changes in healthcare laws, you may see results of your pathology and/or laboratory studies on MyChart before the doctors have had a chance to review them. We understand that in some cases there may be results that are confusing or concerning to you. Please understand that not all results are received at the same time and often the doctors may need to interpret multiple results in order to provide you with the best plan of care or course of treatment. Therefore, we ask that you please give [place] 2 business days to thoroughly review all your results before contacting the office for clarification. Should we see a critical lab result, you will be contacted sooner.   If You Need Anything After Your Visit  If you have any questions or concerns for your doctor, please call our main line at [phone] If no one answers, please leave a voicemail as directed and we will return  your call as soon as possible. Messages left after 4 pm will be answered the following business day.   You may also send [place] a message via MyChart. We typically respond to MyChart messages within 1-2 business days.  For prescription refills, please ask your pharmacy to contact our office. Our fax number is [phone].  If you have an urgent issue when the clinic is closed that cannot wait until the next business day, you can page your doctor at the number below.    Please note that while we do our best to be available for urgent issues outside  of office hours, we are not available 24/7.   If you have an urgent issue and are unable to reach [place], you may choose to seek medical care at your doctor's office, retail clinic, urgent care center, or emergency room.  If you have a medical emergency, please immediately call 911 or go to the emergency department. In the event of inclement weather, please call our main line at [phone] for an update on the status of any delays or closures.  Dermatology Medication Tips: Please keep the boxes that topical medications come in in order to help keep track of the instructions about where and how to use these. Pharmacies typically print the medication instructions only on the boxes and not directly on the medication tubes.   If your medication is too expensive, please contact our office at [phone] or send [place] a message through MyChart.   We are unable to tell what your co-pay for medications will be in advance as this is different depending on your insurance coverage. However, we may be able to find a substitute medication at lower cost or fill out paperwork to get insurance to cover a needed medication.   If a prior authorization is required to get your medication covered by your insurance company, please allow [place] 1-2 business days to complete this process.  Drug prices often vary depending on where the prescription is filled and some pharmacies may offer cheaper prices.  The website www.goodrx.com contains coupons for medications through different pharmacies. The prices here do not account for what the cost may be with help from insurance (it may be cheaper with your insurance), but the website can give you the price if you did not use any insurance.  - You can print the associated coupon and take it with your prescription to the pharmacy.  - You may also stop by our office during regular business hours and pick up a GoodRx coupon card.  - If you need your prescription sent electronically to a  different pharmacy, notify our office through [facility] or by phone at [phone]

## 2023-03-18 NOTE — Progress Notes (Signed)
New Patient Visit   Subjective  Bob Mueller is a 63 y.o. male who presents for the following: reports a irritated spot at back, he states has been bothering him over a year.   Patient also has a spot under right eye he would like checked.    Patient denies personal or family history of skin cancer.   The patient has spots, moles and lesions to be evaluated, some may be new or changing and the patient may have concern these could be cancer.   The following portions of the chart were reviewed this encounter and updated as appropriate: medications, allergies, medical history  Review of Systems:  No other skin or systemic complaints except as noted in HPI or Assessment and Plan.  Objective  Well appearing patient in no apparent distress; mood and affect are within normal limits.   A focused examination was performed of the following areas:  Spot under right eye , back    Relevant exam findings are noted in the Assessment and Plan.  Mid Back 4 mm black papule   right back x 3 (3) Erythematous stuck-on, waxy papule or plaque  Assessment & Plan   Filiform Warts on Right Lower Eyelid and Right Medial Canthus Assessment: Patient presents with tag-like verrucous papules on the right lower eyelid and right medial canthus, consistent with filiform warts. These lesions are benign and characterized by their projecting, finger-like appearance.  Plan: Cryotherapy with liquid nitrogen. Patient education on post-treatment expectations, including lesions becoming crusty and falling off.  Seborrheic Keratoses on Back Assessment: Multiple seborrheic keratoses identified on the patient's back, characterized as benign, epidermal growths often associated with aging. Three lesions were noted, with one described as darker than the others.  Plan: Cryotherapy with liquid nitrogen for one symptomatic lesion. Patient education on the benign nature of lesions and their potential for  growth over time. Option given to patient for treatment of additional lesions or observation.  Suspicious Pigmented Lesion on Mid Lower Back Assessment: A 4mm black pigmented lesion identified on the mid lower back, raising concern for possible pre-cancerous or cancerous growth. Further evaluation is necessary.  Plan: Perform epidermal shave biopsy of the 4mm black lesion on mid lower back. Provide wound care instructions. Follow up for biopsy results and further management as indicated.  Skin Cancer Screening Plan: Schedule full-body skin cancer screening for late spring or summer for comprehensive evaluation of patient's skin health.  NEOPLASM OF UNCERTAIN BEHAVIOR Mid Back Epidermal / dermal shaving  Lesion diameter (cm):  0.4 Informed consent: discussed and consent obtained   Patient was prepped and draped in usual sterile fashion: Area prepped with alcohol. Anesthesia: the lesion was anesthetized in a standard fashion   Anesthetic:  1% lidocaine w/ epinephrine 1-100,000 buffered w/ 8.4% NaHCO3 Instrument used: flexible razor blade   Hemostasis achieved with: pressure, aluminum chloride and electrodesiccation   Outcome: patient tolerated procedure well   Post-procedure details: wound care instructions given   Post-procedure details comment:  Ointment and small bandage applied.  Specimen 1 - Surgical pathology Differential Diagnosis: r/o dysplastic nevus   Check Margins: No R/o dysplastic nevus  INFLAMED SEBORRHEIC KERATOSIS (3) right back x 3 (3) Symptomatic, irritating, patient would like treated. Destruction of lesion - right back x 3 (3)  Destruction method: cryotherapy   Informed consent: discussed and consent obtained   Lesion destroyed using liquid nitrogen: Yes   Region frozen until ice ball extended beyond lesion: Yes   Outcome: patient tolerated procedure  well with no complications   Post-procedure details: wound care instructions given   Additional details:   Prior to procedure, discussed risks of blister formation, small wound, skin dyspigmentation, or rare scar following cryotherapy. Recommend Vaseline ointment to treated areas while healing.  OTHER VIRAL WARTS Right Lower Eyelid Viral Wart (HPV) Counseling  Discussed viral / HPV (Human Papilloma Virus) etiology and risk of spread /infectivity to other areas of body as well as to other people.  Multiple treatments and methods may be required to clear warts and it is possible treatment may not be successful.  Treatment risks include discoloration; scarring and there is still potential for wart recurrence.  No follow-ups on file.  I, Asher Muir, CMA, am acting as scribe for [employer], DO.   Documentation: I have reviewed the above documentation for accuracy and completeness, and I agree with the above.  Langston Reusing, DO

## 2023-03-20 LAB — SURGICAL PATHOLOGY

## 2023-04-01 ENCOUNTER — Encounter: Payer: Self-pay | Admitting: Dermatology

## 2023-04-01 NOTE — Progress Notes (Signed)
 Hi Marquise A Dilley  Dr. Onalee Hua reviewed your biopsy results and they showed the spot removed was a little "abnormal" but not cancerous.  No additional treatment is required because the entire lesions was removed w/ the biopsy.  We will continue to monitor the area for re-pigmentation during your annual skin exams. The detailed report is available to view in MyChart.  Have a great day!  Kind Regards,  Dr. Kermit Balo Care Team

## 2023-12-15 ENCOUNTER — Encounter: Payer: Self-pay | Admitting: Emergency Medicine

## 2023-12-15 ENCOUNTER — Ambulatory Visit (INDEPENDENT_AMBULATORY_CARE_PROVIDER_SITE_OTHER): Admitting: Emergency Medicine

## 2023-12-15 ENCOUNTER — Ambulatory Visit: Payer: Self-pay

## 2023-12-15 VITALS — BP 118/80 | HR 69 | Temp 98.2°F | Ht 70.0 in | Wt 159.0 lb

## 2023-12-15 DIAGNOSIS — J329 Chronic sinusitis, unspecified: Secondary | ICD-10-CM | POA: Insufficient documentation

## 2023-12-15 DIAGNOSIS — B9689 Other specified bacterial agents as the cause of diseases classified elsewhere: Secondary | ICD-10-CM | POA: Insufficient documentation

## 2023-12-15 DIAGNOSIS — R0981 Nasal congestion: Secondary | ICD-10-CM | POA: Diagnosis not present

## 2023-12-15 MED ORDER — AMOXICILLIN-POT CLAVULANATE 875-125 MG PO TABS: 1.0000 | ORAL_TABLET | Freq: Two times a day (BID) | ORAL | 0 refills | Status: DC

## 2023-12-15 MED ORDER — AMOXICILLIN-POT CLAVULANATE 875-125 MG PO TABS: 1.0000 | ORAL_TABLET | Freq: Two times a day (BID) | ORAL | 0 refills | Status: AC

## 2023-12-15 NOTE — Progress Notes (Signed)
 Bob Mueller 63 y.o.   Chief Complaint  Patient presents with   Sinus Problem    Pt states that his head is stuffy and has not been feeling good, lack of sleep    Toe Pain    Pt states that both feet feel like there are needles sticking in them     HISTORY OF PRESENT ILLNESS: Acute problem visit today This is a 63 y.o. male complaining of possible sinus infection that started a couple weeks ago. Progressively getting worse.  Mostly complaining of sinus congestion and lack of sleep Cough at times productive No other complaints or medical concerns today.   Sinus Problem Associated symptoms include congestion and coughing. Pertinent negatives include no chills, headaches or sore throat.  Toe Pain      Prior to Admission medications   Medication Sig Start Date End Date Taking? Authorizing Provider  Ascorbic Acid (VITAMIN C ADULT GUMMIES PO) Take by mouth once.    [provider]  ibuprofen  (ADVIL ,MOTRIN ) 800 MG tablet  07/23/16   [provider]  Multiple Vitamin (MULTIVITAMIN) capsule Take 1 capsule by mouth daily.    [provider]  Omega 3 1000 MG CAPS Take 1 capsule by mouth daily.    [provider]    No Known Allergies  Patient Active Problem List   Diagnosis Date Noted   EIC (epidermal inclusion cyst) 03/09/2021   Encounter for general adult medical examination with abnormal findings 03/05/2021   Subacute cough 03/05/2021   Colon cancer screening 03/05/2021   Need for hepatitis C screening test 03/05/2021   HNP (herniated nucleus pulposus), cervical 09/04/2016    Past Medical History:  Diagnosis Date   Arthritis    Headache    migraines   Skull fracture (HCC)     Past Surgical History:  Procedure Laterality Date   ANTERIOR CERVICAL DECOMP/DISCECTOMY FUSION N/A 09/04/2016   Procedure: C5-6, C6-7 Anterior Cervical Discectomy and Fusion, Allograft, Plate;  Surgeon: Barbarann Oneil BROCKS, MD;  Location: [facility] OR;  Service:  Orthopedics;  Laterality: N/A;   TONSILLECTOMY      Social History   Socioeconomic History   Marital status: Married    Spouse name: Not on file   Number of children: Not on file   Years of education: Not on file   Highest education level: Not on file  Occupational History   Not on file  Tobacco Use   Smoking status: Some Days    Types: Cigars   Smokeless tobacco: Never   Tobacco comments:    smokes cigar very rarely  Vaping Use   Vaping status: Never Used  Substance and Sexual Activity   Alcohol use: Not Currently    Comment: rarely   Drug use: Yes    Frequency: 3.0 times per week    Types: Marijuana    Comment: 2-3 times/week   Sexual activity: Yes    Partners: Female  Other Topics Concern   Not on file  Social History Narrative   Not on file   Social Drivers of Health   Financial Resource Strain: Not on file  Food Insecurity: Not on file  Transportation Needs: Not on file  Physical Activity: Not on file  Stress: Not on file  Social Connections: Not on file  Intimate Partner Violence: Not on file    Family History  Problem Relation Age of Onset   Diabetes Mother    Migraines Mother    Dementia Father    Stroke Father  Diabetes Sister      Review of Systems  Constitutional: Negative.  Negative for chills and fever.  HENT:  Positive for congestion. Negative for sore throat.   Respiratory:  Positive for cough and sputum production.   Cardiovascular: Negative.  Negative for chest pain and palpitations.  Gastrointestinal:  Negative for abdominal pain, nausea and vomiting.  Genitourinary: Negative.  Negative for dysuria and hematuria.  Skin: Negative.  Negative for rash.  Neurological: Negative.  Negative for dizziness and headaches.  All other systems reviewed and are negative.   Vitals:   12/15/23 1436  BP: 118/80  Pulse: 69  Temp: 98.2 F (36.8 C)  SpO2: 98%    Physical Exam Vitals reviewed.  Constitutional:      Appearance: Normal  appearance.  HENT:     Head: Normocephalic.     Right Ear: Tympanic membrane, ear canal and external ear normal.     Left Ear: Tympanic membrane, ear canal and external ear normal.     Nose: Congestion present.     Mouth/Throat:     Mouth: Mucous membranes are moist.     Pharynx: Oropharynx is clear.  Eyes:     Extraocular Movements: Extraocular movements intact.     Pupils: Pupils are equal, round, and reactive to light.  Cardiovascular:     Rate and Rhythm: Normal rate and regular rhythm.     Pulses: Normal pulses.     Heart sounds: Normal heart sounds.  Pulmonary:     Effort: Pulmonary effort is normal.     Breath sounds: Normal breath sounds.  Musculoskeletal:     Cervical back: No tenderness.  Lymphadenopathy:     Cervical: No cervical adenopathy.  Skin:    General: Skin is warm and dry.  Neurological:     General: No focal deficit present.     Mental Status: He is alert and oriented to person, place, and time.  Psychiatric:        Mood and Affect: Mood normal.        Behavior: Behavior normal.      ASSESSMENT & PLAN: Problem List Items Addressed This Visit       Respiratory   Sinus congestion   Symptom management discussed Continue daytime Sudafed and nighttime NyQuil Advised to rest and stay well-hydrated Recommend daily frequent nasal saline sprays Nettie pot recommended      Bacterial sinusitis - Primary   Clinically stable.  No red flag signs or symptoms Recommend Augmentin  875 mg twice a day for 10 days Symptom management discussed Advised to contact the office if no better or worse during the next several days or weeks Recommend follow-up with PCP      Relevant Medications   amoxicillin -clavulanate (AUGMENTIN ) 875-125 MG tablet   Patient Instructions  Sinus Infection, Adult A sinus infection is soreness and swelling (inflammation) of your sinuses. Sinuses are hollow spaces in the bones around your face. They are located: Around your eyes. In  the middle of your forehead. Behind your nose. In your cheekbones. Your sinuses and nasal passages are lined with a fluid called mucus. Mucus drains out of your sinuses. Swelling can trap mucus in your sinuses. This lets germs (bacteria, virus, or fungus) grow, which leads to infection. Most of the time, this condition is caused by a virus. What are the causes? Allergies. Asthma. Germs. Things that block your nose or sinuses. Growths in the nose (nasal polyps). Chemicals or irritants in the air. A fungus. This is rare.  What increases the risk? Having a weak body defense system (immune system). Doing a lot of swimming or diving. Using nasal sprays too much. Smoking. What are the signs or symptoms? The main symptoms of this condition are pain and a feeling of pressure around the sinuses. Other symptoms include: Stuffy nose (congestion). This may make it hard to breathe through your nose. Runny nose (drainage). Soreness, swelling, and warmth in the sinuses. A cough that may get worse at night. Being unable to smell and taste. Mucus that collects in the throat or the back of the nose (postnasal drip). This may cause a sore throat or bad breath. Being very tired (fatigued). A fever. How is this diagnosed? Your symptoms. Your medical history. A physical exam. Tests to find out if your condition is short-term (acute) or long-term (chronic). Your doctor may: Check your nose for growths (polyps). Check your sinuses using a tool that has a light on one end (endoscope). Check for allergies or germs. Do imaging tests, such as an MRI or CT scan. How is this treated? Treatment for this condition depends on the cause and whether it is short-term or long-term. If caused by a virus, your symptoms should go away on their own within 10 days. You may be given medicines to relieve symptoms. They include: Medicines that shrink swollen tissue in the nose. A spray that treats swelling of the  nostrils. Rinses that help get rid of thick mucus in your nose (nasal saline washes). Medicines that treat allergies (antihistamines). Over-the-counter pain relievers. If caused by bacteria, your doctor may wait to see if you will get better without treatment. You may be given antibiotic medicine if you have: A very bad infection. A weak body defense system. If caused by growths in the nose, surgery may be needed. Follow these instructions at home: Medicines Take, use, or apply over-the-counter and prescription medicines only as told by your doctor. These may include nasal sprays. If you were prescribed an antibiotic medicine, take it as told by your doctor. Do not stop taking it even if you start to feel better. Hydrate and humidify  Drink enough water to keep your pee (urine) pale yellow. Use a cool mist humidifier to keep the humidity level in your home above 50%. Breathe in steam for 10-15 minutes, 3-4 times a day, or as told by your doctor. You can do this in the bathroom while a hot shower is running. Try not to spend time in cool or dry air. Rest Rest as much as you can. Sleep with your head raised (elevated). Make sure you get enough sleep each night. General instructions  Put a warm, moist washcloth on your face 3-4 times a day, or as often as told by your doctor. Use nasal saline washes as often as told by your doctor. Wash your hands often with soap and water. If you cannot use soap and water, use hand sanitizer. Do not smoke. Avoid being around people who are smoking (secondhand smoke). Keep all follow-up visits. Contact a doctor if: You have a fever. Your symptoms get worse. Your symptoms do not get better within 10 days. Get help right away if: You have a very bad headache. You cannot stop vomiting. You have very bad pain or swelling around your face or eyes. You have trouble seeing. You feel confused. Your neck is stiff. You have trouble breathing. These  symptoms may be an emergency. Get help right away. Call 911. Do not wait to see if the  symptoms will go away. Do not drive yourself to the hospital. Summary A sinus infection is swelling of your sinuses. Sinuses are hollow spaces in the bones around your face. This condition is caused by tissues in your nose that become inflamed or swollen. This traps germs. These can lead to infection. If you were prescribed an antibiotic medicine, take it as told by your doctor. Do not stop taking it even if you start to feel better. Keep all follow-up visits. This information is not intended to replace advice given to you by your health care provider. Make sure you discuss any questions you have with your health care provider. Document Revised: 12/26/2020 Document Reviewed: 12/26/2020 Elsevier Patient Education  2024 [employer]    Emil Schaumann, MD [facility]

## 2023-12-15 NOTE — Assessment & Plan Note (Signed)
 Clinically stable.  No red flag signs or symptoms Recommend Augmentin  875 mg twice a day for 10 days Symptom management discussed Advised to contact the office if no better or worse during the next several days or weeks Recommend follow-up with PCP

## 2023-12-15 NOTE — Telephone Encounter (Signed)
 FYI Only or Action Required?: Action required by provider: request for appointment and referral request.  Patient was last seen in primary care on 12/15/2023 by Purcell Emil Schanz, MD.  Called Nurse Triage reporting Fatigue.  Symptoms began several weeks ago.  Interventions attempted: Nothing.  Symptoms are: unchanged.  Triage Disposition: See PCP Within 2 Weeks  Patient/caregiver understands and will follow disposition?: Yes   Copied from CRM [id number]. Topic: Clinical - Red Word Triage >> Dec 15, 2023  3:37 PM Alexandria E wrote: [facility] that prompted transfer to Nurse Triage: Patient's spouse, Jon, called in stating that patient is feeling fatigued since getting 3 vaccinations all at once: flu, pneumonia, shingles during the middle of October. Reason for Disposition  Weakness is a chronic symptom (recurrent or ongoing AND present > 4 weeks)  Answer Assessment - Initial Assessment Questions Patient seen in clinic today, 12/15/23, and reports was not able to discuss fatigue, muscle aching since vaccination 1019/25 or referral for toe and ear; MD did not return to room.  Advised call back or UC/ED if symptoms worsen.  Pt's wife requests only visit with Dr. Debby Molt and scheduled appt for soonest available 12/31/23.  Pt's wife requests call back and referral.  1. DESCRIPTION: Describe how you are feeling.     fatigue and bones/ muscles are aching 2. SEVERITY: How bad is it?  Can you stand and walk?     Able to stand walk and eat drink 3. ONSET: When did these symptoms begin? (e.g., hours, days, weeks, months)     Month ago; 11/23/23 4. CAUSE: What do you think is causing the weakness or fatigue? (e.g., not drinking enough fluids, medical problem, trouble sleeping)     vaccines  6. OTHER SYMPTOMS: Do you have any other symptoms? (e.g., chest pain, fever, cough, SOB, vomiting, diarrhea, bleeding, other areas of pain)     denies  Protocols used: Weakness  (Generalized) and Fatigue-A-AH

## 2023-12-15 NOTE — Assessment & Plan Note (Signed)
 Symptom management discussed Continue daytime Sudafed and nighttime NyQuil Advised to rest and stay well-hydrated Recommend daily frequent nasal saline sprays Nettie pot recommended

## 2023-12-15 NOTE — Patient Instructions (Signed)

## 2023-12-16 NOTE — Telephone Encounter (Signed)
 Patient was seen by Dr. Purcell, patient has an appointment with Podiatry and Dr. Joshua scheduled. Patient doesn't need any further assistance at the moment he will call back if he needs anything else.

## 2023-12-23 DIAGNOSIS — I739 Peripheral vascular disease, unspecified: Secondary | ICD-10-CM | POA: Diagnosis not present

## 2023-12-23 DIAGNOSIS — G629 Polyneuropathy, unspecified: Secondary | ICD-10-CM | POA: Diagnosis not present

## 2023-12-23 DIAGNOSIS — M19071 Primary osteoarthritis, right ankle and foot: Secondary | ICD-10-CM | POA: Diagnosis not present

## 2023-12-23 DIAGNOSIS — M792 Neuralgia and neuritis, unspecified: Secondary | ICD-10-CM | POA: Diagnosis not present

## 2023-12-23 DIAGNOSIS — M2022 Hallux rigidus, left foot: Secondary | ICD-10-CM | POA: Diagnosis not present

## 2023-12-23 DIAGNOSIS — M19072 Primary osteoarthritis, left ankle and foot: Secondary | ICD-10-CM | POA: Diagnosis not present

## 2023-12-23 DIAGNOSIS — M2021 Hallux rigidus, right foot: Secondary | ICD-10-CM | POA: Diagnosis not present

## 2023-12-31 ENCOUNTER — Ambulatory Visit: Admitting: Internal Medicine

## 2023-12-31 ENCOUNTER — Encounter: Payer: Self-pay | Admitting: Internal Medicine

## 2023-12-31 ENCOUNTER — Ambulatory Visit: Payer: Self-pay | Admitting: Internal Medicine

## 2023-12-31 VITALS — BP 114/68 | HR 65 | Temp 98.5°F | Ht 70.0 in | Wt 159.6 lb

## 2023-12-31 DIAGNOSIS — Z0001 Encounter for general adult medical examination with abnormal findings: Secondary | ICD-10-CM

## 2023-12-31 DIAGNOSIS — R001 Bradycardia, unspecified: Secondary | ICD-10-CM | POA: Diagnosis not present

## 2023-12-31 DIAGNOSIS — Z0000 Encounter for general adult medical examination without abnormal findings: Secondary | ICD-10-CM

## 2023-12-31 DIAGNOSIS — E785 Hyperlipidemia, unspecified: Secondary | ICD-10-CM

## 2023-12-31 DIAGNOSIS — I950 Idiopathic hypotension: Secondary | ICD-10-CM | POA: Diagnosis not present

## 2023-12-31 LAB — LIPID PANEL
Cholesterol: 210 mg/dL — ABNORMAL HIGH (ref 0–200)
HDL: 55.8 mg/dL (ref >39.00)
LDL Cholesterol: 127 mg/dL — ABNORMAL HIGH (ref 0–99)
NonHDL: 154.52
Total CHOL/HDL Ratio: 4
Triglycerides: 136 mg/dL (ref 0.0–149.0)
VLDL: 27.2 mg/dL (ref 0.0–40.0)

## 2023-12-31 LAB — CBC WITH DIFFERENTIAL/PLATELET
Basophils Absolute: 0 K/uL (ref 0.0–0.1)
Basophils Relative: 0.5 % (ref 0.0–3.0)
Eosinophils Absolute: 0.3 K/uL (ref 0.0–0.7)
Eosinophils Relative: 4.6 % (ref 0.0–5.0)
HCT: 41.5 % (ref 39.0–52.0)
Hemoglobin: 13.8 g/dL (ref 13.0–17.0)
Lymphocytes Relative: 28.5 % (ref 12.0–46.0)
Lymphs Abs: 1.8 K/uL (ref 0.7–4.0)
MCHC: 33.3 g/dL (ref 30.0–36.0)
MCV: 92.1 fl (ref 78.0–100.0)
Monocytes Absolute: 0.6 K/uL (ref 0.1–1.0)
Monocytes Relative: 9.2 % (ref 3.0–12.0)
Neutro Abs: 3.6 K/uL (ref 1.4–7.7)
Neutrophils Relative %: 57.2 % (ref 43.0–77.0)
Platelets: 229 K/uL (ref 150.0–400.0)
RBC: 4.51 Mil/uL (ref 4.22–5.81)
RDW: 13.6 % (ref 11.5–15.5)
WBC: 6.4 K/uL (ref 4.0–10.5)

## 2023-12-31 LAB — PSA: PSA: 1.7 ng/mL (ref 0.10–4.00)

## 2023-12-31 LAB — BASIC METABOLIC PANEL WITH GFR
BUN: 13 mg/dL (ref 6–23)
CO2: 31 meq/L (ref 19–32)
Calcium: 9 mg/dL (ref 8.4–10.5)
Chloride: 102 meq/L (ref 96–112)
Creatinine, Ser: 0.72 mg/dL (ref 0.40–1.50)
GFR: 98.32 mL/min (ref >60.00)
Glucose, Bld: 85 mg/dL (ref 70–99)
Potassium: 4.5 meq/L (ref 3.5–5.1)
Sodium: 138 meq/L (ref 135–145)

## 2023-12-31 LAB — URINALYSIS, ROUTINE W REFLEX MICROSCOPIC
Bilirubin Urine: NEGATIVE
Hgb urine dipstick: NEGATIVE
Ketones, ur: NEGATIVE
Nitrite: NEGATIVE
RBC / HPF: NONE SEEN (ref 0–?)
Specific Gravity, Urine: 1.01 (ref 1.000–1.030)
Total Protein, Urine: NEGATIVE
Urine Glucose: NEGATIVE
Urobilinogen, UA: 0.2 (ref 0.0–1.0)
pH: 6.5 (ref 5.0–8.0)

## 2023-12-31 LAB — HEPATIC FUNCTION PANEL
ALT: 15 U/L (ref 0–53)
AST: 21 U/L (ref 0–37)
Albumin: 4.2 g/dL (ref 3.5–5.2)
Alkaline Phosphatase: 48 U/L (ref 39–117)
Bilirubin, Direct: 0.1 mg/dL (ref 0.0–0.3)
Total Bilirubin: 0.4 mg/dL (ref 0.2–1.2)
Total Protein: 6.5 g/dL (ref 6.0–8.3)

## 2023-12-31 LAB — CORTISOL: Cortisol, Plasma: 8.2 ug/dL

## 2023-12-31 LAB — TSH: TSH: 4.78 u[IU]/mL (ref 0.35–5.50)

## 2023-12-31 MED ORDER — ROSUVASTATIN CALCIUM 10 MG PO TABS: 10.0000 mg | ORAL_TABLET | Freq: Every day | ORAL | 1 refills | Status: AC

## 2023-12-31 NOTE — Patient Instructions (Signed)
 Health Maintenance, Male  Adopting a healthy lifestyle and getting preventive care are important in promoting health and wellness. Ask your health care provider about:  The right schedule for you to have regular tests and exams.  Things you can do on your own to prevent diseases and keep yourself healthy.  What should I know about diet, weight, and exercise?  Eat a healthy diet    Eat a diet that includes plenty of vegetables, fruits, low-fat dairy products, and lean protein.  Do not eat a lot of foods that are high in solid fats, added sugars, or sodium.  Maintain a healthy weight  Body mass index (BMI) is a measurement that can be used to identify possible weight problems. It estimates body fat based on height and weight. Your health care provider can help determine your BMI and help you achieve or maintain a healthy weight.  Get regular exercise  Get regular exercise. This is one of the most important things you can do for your health. Most adults should:  Exercise for at least 150 minutes each week. The exercise should increase your heart rate and make you sweat (moderate-intensity exercise).  Do strengthening exercises at least twice a week. This is in addition to the moderate-intensity exercise.  Spend less time sitting. Even light physical activity can be beneficial.  Watch cholesterol and blood lipids  Have your blood tested for lipids and cholesterol at 62 years of age, then have this test every 5 years.  You may need to have your cholesterol levels checked more often if:  Your lipid or cholesterol levels are high.  You are older than 62 years of age.  You are at high risk for heart disease.  What should I know about cancer screening?  Many types of cancers can be detected early and may often be prevented. Depending on your health history and family history, you may need to have cancer screening at various ages. This may include screening for:  Colorectal cancer.  Prostate cancer.  Skin cancer.  Lung  cancer.  What should I know about heart disease, diabetes, and high blood pressure?  Blood pressure and heart disease  High blood pressure causes heart disease and increases the risk of stroke. This is more likely to develop in people who have high blood pressure readings or are overweight.  Talk with your health care provider about your target blood pressure readings.  Have your blood pressure checked:  Every 3-5 years if you are 24-52 years of age.  Every year if you are 3 years old or older.  If you are between the ages of 60 and 72 and are a current or former smoker, ask your health care provider if you should have a one-time screening for abdominal aortic aneurysm (AAA).  Diabetes  Have regular diabetes screenings. This checks your fasting blood sugar level. Have the screening done:  Once every three years after age 66 if you are at a normal weight and have a low risk for diabetes.  More often and at a younger age if you are overweight or have a high risk for diabetes.  What should I know about preventing infection?  Hepatitis B  If you have a higher risk for hepatitis B, you should be screened for this virus. Talk with your health care provider to find out if you are at risk for hepatitis B infection.  Hepatitis C  Blood testing is recommended for:  Everyone born from [birth year] through 1964.  Anyone  with known risk factors for hepatitis C.  Sexually transmitted infections (STIs)  You should be screened each year for STIs, including gonorrhea and chlamydia, if:  You are sexually active and are younger than 62 years of age.  You are older than 62 years of age and your health care provider tells you that you are at risk for this type of infection.  Your sexual activity has changed since you were last screened, and you are at increased risk for chlamydia or gonorrhea. Ask your health care provider if you are at risk.  Ask your health care provider about whether you are at high risk for HIV. Your health care provider  may recommend a prescription medicine to help prevent HIV infection. If you choose to take medicine to prevent HIV, you should first get tested for HIV. You should then be tested every 3 months for as long as you are taking the medicine.  Follow these instructions at home:  Alcohol use  Do not drink alcohol if your health care provider tells you not to drink.  If you drink alcohol:  Limit how much you have to 0-2 drinks a day.  Know how much alcohol is in your drink. In the [place] one drink equals one 12 oz bottle of beer (355 mL), one 5 oz glass of wine (148 mL), or one 1 oz glass of hard liquor (44 mL).  Lifestyle  Do not use any products that contain nicotine or tobacco. These products include cigarettes, chewing tobacco, and vaping devices, such as e-cigarettes. If you need help quitting, ask your health care provider.  Do not use street drugs.  Do not share needles.  Ask your health care provider for help if you need support or information about quitting drugs.  General instructions  Schedule regular health, dental, and eye exams.  Stay current with your vaccines.  Tell your health care provider if:  You often feel depressed.  You have ever been abused or do not feel safe at home.  Summary  Adopting a healthy lifestyle and getting preventive care are important in promoting health and wellness.  Follow your health care provider's instructions about healthy diet, exercising, and getting tested or screened for diseases.  Follow your health care provider's instructions on monitoring your cholesterol and blood pressure.  This information is not intended to replace advice given to you by your health care provider. Make sure you discuss any questions you have with your health care provider.  Document Revised: 06/12/2020 Document Reviewed: 06/12/2020  Elsevier Patient Education  2024 [employer].
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
